# Patient Record
Sex: Female | Born: 2000 | Race: White | Hispanic: No | Marital: Single | Smoking: Never smoker
Health system: Southern US, Community
[De-identification: ages and names within clinical notes are randomized; demographics above are authoritative.]

## PROBLEM LIST (undated history)

## (undated) DIAGNOSIS — L509 Urticaria, unspecified: Secondary | ICD-10-CM

## (undated) DIAGNOSIS — T783XXA Angioneurotic edema, initial encounter: Secondary | ICD-10-CM

## (undated) DIAGNOSIS — T7840XA Allergy, unspecified, initial encounter: Secondary | ICD-10-CM

## (undated) DIAGNOSIS — R6889 Other general symptoms and signs: Secondary | ICD-10-CM

## (undated) DIAGNOSIS — J069 Acute upper respiratory infection, unspecified: Secondary | ICD-10-CM

## (undated) DIAGNOSIS — Z889 Allergy status to unspecified drugs, medicaments and biological substances status: Secondary | ICD-10-CM

## (undated) DIAGNOSIS — K219 Gastro-esophageal reflux disease without esophagitis: Secondary | ICD-10-CM

## (undated) DIAGNOSIS — F419 Anxiety disorder, unspecified: Secondary | ICD-10-CM

## (undated) DIAGNOSIS — F32A Depression, unspecified: Secondary | ICD-10-CM

## (undated) HISTORY — DX: Anxiety disorder, unspecified: F41.9

## (undated) HISTORY — DX: Allergy, unspecified, initial encounter: T78.40XA

## (undated) HISTORY — DX: Other general symptoms and signs: R68.89

## (undated) HISTORY — DX: Allergy status to unspecified drugs, medicaments and biological substances: Z88.9

## (undated) HISTORY — DX: Urticaria, unspecified: L50.9

## (undated) HISTORY — DX: Gastro-esophageal reflux disease without esophagitis: K21.9

## (undated) HISTORY — DX: Acute upper respiratory infection, unspecified: J06.9

## (undated) HISTORY — DX: Angioneurotic edema, initial encounter: T78.3XXA

## (undated) HISTORY — DX: Depression, unspecified: F32.A

---
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
## 2019-01-08 ENCOUNTER — Ambulatory Visit (INDEPENDENT_AMBULATORY_CARE_PROVIDER_SITE_OTHER): Payer: BLUE CROSS/BLUE SHIELD | Admitting: *Deleted

## 2019-01-08 DIAGNOSIS — J4550 Severe persistent asthma, uncomplicated: Secondary | ICD-10-CM | POA: Diagnosis not present

## 2019-01-10 ENCOUNTER — Other Ambulatory Visit: Payer: Self-pay | Admitting: Family Medicine

## 2019-01-10 DIAGNOSIS — L700 Acne vulgaris: Secondary | ICD-10-CM

## 2019-01-11 NOTE — Telephone Encounter (Signed)
CPE rescheduled for April 21, 2019-is it okay to refill until then?

## 2019-01-14 ENCOUNTER — Encounter: Payer: Self-pay | Admitting: Family Medicine

## 2019-01-14 ENCOUNTER — Other Ambulatory Visit (INDEPENDENT_AMBULATORY_CARE_PROVIDER_SITE_OTHER): Payer: BLUE CROSS/BLUE SHIELD

## 2019-01-14 ENCOUNTER — Other Ambulatory Visit: Payer: Self-pay

## 2019-01-14 DIAGNOSIS — Z23 Encounter for immunization: Secondary | ICD-10-CM

## 2019-01-21 ENCOUNTER — Ambulatory Visit: Payer: 59

## 2019-01-25 ENCOUNTER — Telehealth: Payer: Self-pay

## 2019-01-25 ENCOUNTER — Ambulatory Visit (INDEPENDENT_AMBULATORY_CARE_PROVIDER_SITE_OTHER): Payer: BLUE CROSS/BLUE SHIELD

## 2019-01-25 DIAGNOSIS — J4550 Severe persistent asthma, uncomplicated: Secondary | ICD-10-CM | POA: Diagnosis not present

## 2019-01-25 NOTE — Telephone Encounter (Signed)
Patient came in to get her Xolair and stated that she has been having problems with itchy eyes and was wondering if we could send in something for her. Please advise.

## 2019-01-27 NOTE — Telephone Encounter (Signed)
Sure.  See if Pazeo 1 drop each eye daily as needed is covered with her insurance plan. Can also try Optivar 1 drop each eye twice daily as needed if Pazeo was not covered. Pataday is also over-the-counter now if there are no recoverable eyedrops.

## 2019-01-27 NOTE — Telephone Encounter (Signed)
It appears that Optivar is covered while Pazeo is not.  Attempted to contact patient to inform and see which pharmacy to sent to.  Unable to leave message due to vm being full.  Meds have not been ordered at this time.

## 2019-01-28 MED ORDER — AZELASTINE HCL 0.05 % OP SOLN: 1.0000 [drp] | Freq: Two times a day (BID) | OPHTHALMIC | 5 refills | Status: DC

## 2019-01-28 NOTE — Telephone Encounter (Signed)
Unable to LVM but was able to send Optivar to [facility].

## 2019-01-28 NOTE — Addendum Note (Signed)
Addended by: Osa Craver on: 01/28/2019 02:18 PM   Modules accepted: Orders

## 2019-02-08 ENCOUNTER — Ambulatory Visit (INDEPENDENT_AMBULATORY_CARE_PROVIDER_SITE_OTHER): Payer: BLUE CROSS/BLUE SHIELD | Admitting: *Deleted

## 2019-02-08 ENCOUNTER — Other Ambulatory Visit: Payer: Self-pay

## 2019-02-08 DIAGNOSIS — J4550 Severe persistent asthma, uncomplicated: Secondary | ICD-10-CM | POA: Diagnosis not present

## 2019-02-09 ENCOUNTER — Other Ambulatory Visit: Payer: Self-pay | Admitting: Allergy and Immunology

## 2019-02-16 ENCOUNTER — Telehealth: Payer: Self-pay | Admitting: Family Medicine

## 2019-02-16 DIAGNOSIS — F419 Anxiety disorder, unspecified: Principal | ICD-10-CM

## 2019-02-16 DIAGNOSIS — F32A Depression, unspecified: Secondary | ICD-10-CM

## 2019-02-16 DIAGNOSIS — F329 Major depressive disorder, single episode, unspecified: Secondary | ICD-10-CM

## 2019-02-16 MED ORDER — CITALOPRAM HYDROBROMIDE 20 MG PO TABS: 20.0000 mg | ORAL_TABLET | Freq: Every day | ORAL | 0 refills | Status: DC

## 2019-02-16 NOTE — Telephone Encounter (Signed)
Pharmacy sent refill for citalopram 20 mg tablet, qty 90 with 1 refill, take one tablet by mouth every day, please send refill to [id number] - [place], [place] - [address] AT [address]

## 2019-02-22 ENCOUNTER — Ambulatory Visit (INDEPENDENT_AMBULATORY_CARE_PROVIDER_SITE_OTHER): Payer: BLUE CROSS/BLUE SHIELD

## 2019-02-22 ENCOUNTER — Other Ambulatory Visit: Payer: Self-pay

## 2019-02-22 DIAGNOSIS — J4550 Severe persistent asthma, uncomplicated: Secondary | ICD-10-CM

## 2019-02-26 ENCOUNTER — Other Ambulatory Visit: Payer: Self-pay

## 2019-02-26 MED ORDER — TRIAMCINOLONE ACETONIDE 55 MCG/ACT NA AERO: INHALATION_SPRAY | NASAL | 0 refills | Status: DC

## 2019-03-09 ENCOUNTER — Ambulatory Visit: Payer: Self-pay

## 2019-03-17 ENCOUNTER — Encounter: Payer: Self-pay | Admitting: Allergy

## 2019-03-17 ENCOUNTER — Ambulatory Visit (INDEPENDENT_AMBULATORY_CARE_PROVIDER_SITE_OTHER): Payer: BC Managed Care – PPO | Admitting: Allergy

## 2019-03-17 ENCOUNTER — Other Ambulatory Visit: Payer: Self-pay

## 2019-03-17 VITALS — BP 106/64 | HR 84 | Temp 98.2°F | Resp 16 | Ht 66.73 in | Wt 146.8 lb

## 2019-03-17 DIAGNOSIS — J301 Allergic rhinitis due to pollen: Secondary | ICD-10-CM

## 2019-03-17 DIAGNOSIS — T7800XD Anaphylactic reaction due to unspecified food, subsequent encounter: Secondary | ICD-10-CM | POA: Diagnosis not present

## 2019-03-17 DIAGNOSIS — J4550 Severe persistent asthma, uncomplicated: Secondary | ICD-10-CM | POA: Diagnosis not present

## 2019-03-17 NOTE — Patient Instructions (Addendum)
Severe persistent asthma   Continue Xolair injections and will try to spread out to every 3 weeks.   If asthma symptoms arise or needing albuterol use then resume every 2 week injections  Call [employer] student health and ask if they are able to provide Xolair injections there and let us know.  If not we will help to identify a local Allergist that will provide while at college.  Have access to albuterol inhaler 2 puffs every 4-6 hours as needed for cough/wheeze/shortness of breath/chest tightness.  May use 15-20 minutes prior to activity.   Monitor frequency of use.    Food Allergy  From June 2019 skin testing was negative which means that Xolair is having some protective effects against your food allergies    Continue avoidance of milk, dairy, shellfish, fish, peanut/tree nuts, coconut, sesame seed, apple, and mango    In case of an allergic reaction, give Benadryl 4 teaspoonfuls every 6 hours, and life-threatening  symptoms occur, inject with EpiPen 0.3 mg    follow emergency action plan in case of allergic reaction  .  Allergic rhinitis  Continue avoidance measures for dust mites, cat, dog, grass pollens, tree pollens,weed pollens and  cockroach  Continue with Claritin 10mg  daily.  Trial Allegra 180mg  to see if this is more effective than Claritin.     Continue Nasacort 2 sprays each nostril daily for nasal congestion  Continue Optivar eye drops 1 drop each eye twice a day as needed  Return in about 6 months or sooner if needed

## 2019-03-17 NOTE — Progress Notes (Signed)
Follow-up Note  RE: Samantha Cole MRN: [id number] DOB: [birth year] Date of Office Visit: 03/17/2019   History of present illness: Samantha Cole is a 18 y.o. female presenting today for follow-up of asthma, food allergy and allergic rhinitis.  She was last seen in the office on 03/27/18 by FNP Thermon Leyland.   She states she has been doing well since her last visit with [place] without any major health changes, surgeries or hospitalizations.  She states that her asthma has been under good control and she has not needed to use her albuterol.  She is on Xolair injections every 2 weeks.  She states today that she is about a week late thus this is her third week and she has not noticed any asthma symptoms or need to use her rescue inhaler.  We did try to space her from every 2 weeks to every 4 weeks about 2 years or so ago and she developed more asthma symptoms at every 4 weeks that she was changed back to 2-week injections with good improvement. She continues to avoid dairy, shellfish, fish, peanuts, tree nuts, sesame, apple and mango.  She has not had any accidental ingestions or need to use her epinephrine device. She does state that she has been having some itchy eyes and she has been using an eyedrop that has been helping.  She also has been having some runny nose and sneezing.  She does take Claritin daily and does have access to Nasacort that she uses as needed.   Review of systems: Review of Systems  Constitutional: Negative for chills, fever and malaise/fatigue.  HENT: Negative for congestion, ear discharge, nosebleeds and sore throat.   Eyes: Negative for pain, discharge and redness.  Respiratory: Negative for cough, shortness of breath and wheezing.   Cardiovascular: Negative for chest pain.  Gastrointestinal: Negative for abdominal pain, constipation, diarrhea, heartburn, nausea and vomiting.  Musculoskeletal: Negative for joint pain.  Skin: Negative for itching and rash.  Neurological: Negative  for headaches.    All other systems negative unless noted above in HPI  Past medical/social/surgical/family history have been reviewed and are unchanged unless specifically indicated below.  She will be attending [employer] in the fall.  Medication List: Allergies as of 03/17/2019      Reactions   Other Anaphylaxis   Tree nuts, Coconut, Sesame   Shellfish Allergy Anaphylaxis   Lactose Intolerance (gi)       Medication List       Accurate as of March 17, 2019 11:59 PM. If you have any questions, ask your nurse or doctor.        STOP taking these medications   azelastine 0.05 % ophthalmic solution Commonly known as:  OPTIVAR Stopped by:  Benjy Kana Larose Hires, MD     TAKE these medications   albuterol 108 (90 Base) MCG/ACT inhaler Commonly known as:  VENTOLIN HFA Inhale 2 puffs into the lungs as needed. Reported on 11/29/2015   citalopram 20 MG tablet Commonly known as:  CELEXA Take 1 tablet (20 mg total) by mouth daily.   Claritin 10 MG tablet Generic drug:  loratadine Take 10 mg by mouth daily.   Tri-Previfem 0.18/0.215/0.25 MG-35 MCG tablet Generic drug:  Norgestimate-Ethinyl Estradiol Triphasic TAKE 1 TABLET BY MOUTH EVERY DAY   triamcinolone 55 MCG/ACT Aero nasal inhaler Commonly known as:  Nasacort Allergy 24HR Use 2 sprays in each nostril once daily   Xolair 150 MG injection Generic drug:  omalizumab INJECT 375 MG UNDER  THE SKIN EVERY 14 DAYS       Known medication allergies: Allergies  Allergen Reactions  . Other Anaphylaxis    Tree nuts, Coconut, Sesame  . Shellfish Allergy Anaphylaxis  . Lactose Intolerance (Gi)      Physical examination: Blood pressure 106/64, pulse 84, temperature 98.2 F (36.8 C), temperature source Tympanic, resp. rate 16, height 5' 6.73" (1.695 m), weight 146 lb 12.8 oz (66.6 kg), SpO2 98 %.  General: Alert, interactive, in no acute distress. HEENT: PERRLA, TMs pearly gray, turbinates mildly edematous with clear  discharge, post-pharynx non erythematous. Neck: Supple without lymphadenopathy. Lungs: Clear to auscultation without wheezing, rhonchi or rales. {no increased work of breathing. CV: Normal S1, S2 without murmurs. Abdomen: Nondistended, nontender. Skin: Warm and dry, without lesions or rashes. Extremities:  No clubbing, cyanosis or edema. Neuro:   Grossly intact.  Diagnositics/Labs: Labs:  Component     Latest Ref Rng & Units 03/27/2018  D Pteronyssinus IgE     Class I kU/L 0.55 (A)  D Farinae IgE     Class II kU/L 1.37 (A)  Cat Dander IgE     Class III kU/L 2.97 (A)  Dog Dander IgE     Class V kU/L 47.50 (A)  [place] Grass IgE     Class III kU/L 1.85 (A)  Timothy Grass IgE     Class III kU/L 1.97 (A)  Johnson Grass IgE     Class III kU/L 1.96 (A)  Bahia Grass IgE     Class III kU/L 2.12 (A)  Cockroach, American IgE     Class I kU/L 0.36 (A)  Penicillium Chrysogen IgE     Class 0 kU/L <0.10  Cladosporium Herbarum IgE     Class 0 kU/L <0.10  Aspergillus Fumigatus IgE     Class 0 kU/L <0.10  Mucor Racemosus IgE     Class 0 kU/L <0.10  Alternaria Alternata IgE     Class 0 kU/L <0.10  Stemphylium Herbarum IgE     Class 0 kU/L <0.10  Common Silver Charletta CousinBirch IgE     Class III kU/L 1.90 (A)  Oak, White IgE     Class III kU/L 2.51 (A)  Elm, American IgE     Class III kU/L 1.97 (A)  Maple/Box Elder IgE     Class III kU/L 2.20 (A)  Hickory, White IgE     Class III kU/L  2.54 (A)  Amer Sycamore IgE Qn     Class III kU/L 2.36 (A)  White Mulberry IgE     Class II kU/L 1.33 (A)  Sweet gum IgE RAST Ql     Class III kU/L 2.36 (A)  Cedar, Mountain IgE     Class III kU/L 2.00 (A)  Ragweed, Short IgE     Class III kU/L 3.60 (A)  Mugwort IgE Qn     Class III kU/L 1.43 (A)  Plantain, English IgE     Class III kU/L 1.78 (A)  Pigweed, Rough IgE     Class III kU/L 1.62 (A)  Sheep Sorrel IgE Qn     Class II kU/L 1.37 (A)  Nettle IgE     Class III kU/L 1.98 (A)  Allergen  Apple, IgE     Class II kU/L 1.17 (A)  Mango IgE     Class II kU/L 1.07 (A)    Assessment and plan:   Severe persistent asthma   Continue Xolair injections and will try to spread out to  every 3 weeks.   If asthma symptoms arise or needing albuterol use then resume every 2 week injections  Call [employer] student health and ask if they are able to provide Xolair injections there and let us know.  If not we will help to identify a local Allergist that will provide while at college.  Have access to albuterol inhaler 2 puffs every 4-6 hours as needed for cough/wheeze/shortness of breath/chest tightness.  May use 15-20 minutes prior to activity.   Monitor frequency of use.    Food Allergy  From June 2019 skin testing was negative which means that Xolair is having some protective effects against your food allergies    Continue avoidance of milk, dairy, shellfish, fish, peanut/tree nuts, coconut, sesame seed, apple, and mango    In case of an allergic reaction, give Benadryl 4 teaspoonfuls every 6 hours, and life-threatening  symptoms occur, inject with EpiPen 0.3 mg    follow emergency action plan in case of allergic reaction  .  Allergic rhinitis  Continue avoidance measures for dust mites, cat, dog, grass pollens, tree pollens,weed pollens and  cockroach  Continue with Claritin 10mg  daily.  Trial Allegra 180mg  to see if this is more effective than Claritin.     Continue Nasacort 2 sprays each nostril daily for nasal congestion  Continue Optivar eye drops 1 drop each eye twice a day as needed  Return in about 6 months or sooner if needed  I appreciate the opportunity to take part in Tameka's care. Please do not hesitate to contact me with questions.  Sincerely,   Margo Aye, MD Allergy/Immunology [facility]

## 2019-03-26 ENCOUNTER — Other Ambulatory Visit: Payer: Self-pay | Admitting: Family Medicine

## 2019-04-07 ENCOUNTER — Ambulatory Visit: Payer: 59

## 2019-04-09 ENCOUNTER — Other Ambulatory Visit: Payer: Self-pay

## 2019-04-09 ENCOUNTER — Telehealth: Payer: Self-pay | Admitting: *Deleted

## 2019-04-09 ENCOUNTER — Ambulatory Visit (INDEPENDENT_AMBULATORY_CARE_PROVIDER_SITE_OTHER): Payer: 59 | Admitting: *Deleted

## 2019-04-09 DIAGNOSIS — J4550 Severe persistent asthma, uncomplicated: Secondary | ICD-10-CM

## 2019-04-09 NOTE — Telephone Encounter (Signed)
Error

## 2019-04-20 NOTE — Progress Notes (Signed)
Chief Complaint  Patient presents with  . Annual Exam    nonfasting annual exam. Brought in form for [facility] and immunizations. No concerns.    Samantha Cole is a 18 y.o. female who presents for a complete physical.  She has the following concerns: She needs forms filled out for school.  Brought in copies of her childhood immunizations.  Denies any other concerns or complaints.  Depression/Anxiety: She was started on citalopram in 05/2018.  She was seeing April Forsbrey at [facility] for counseling. Prior to starting meds: PHQ9 score of 14 GAD-7 score of 12  This was last discussed at her 07/2018 visit, at which time, she was doing better, procrastinating less, spending more time with friends. Scores at her f/u visit were: PHQ-9 score of 6  GAD-7 score of 5  Moods are good, just randomly feels down, once a month, not related to cycles, short-lived. She denies side effects from the medications, and feels like it is working well.  She will be attending [employer] in the Fall. Her boyfriend will be 4 hours away. 2nd lifetime partner, uses condoms. She is on OCP's for cramps and acne.  Menstrual cramps are significantly better, only occur one day of the cycle.  Periods are light and shorter.   Menarche age 9, monthly. Not heavy (on OCP's). Uses ibuprofen as needed for cramps  Acne. She has been on OCP's since 12/2016. Using Curology skin products, which is helping with her acne. No longer using Differin.  She has asthma and allergies, [facility]. She is on Xolair q3 weeks and doing well (changed to q 3 weeks from q2 weeks about 5 weeks ago, previously didn't do as well with q4 week schedule).  Needs albuterol only rarely. Uses claritin as needed for allergies, and nasal steroid only prn congestion. She has tree-nut and shellfish allergy. She has an Auvi-Q pen.  We wrote letter to try and get a single room at [facility] due to her allergies. She reports  she was assigned a roommate, and she is okay with that.   January 2018: She had Hep B Core antibody + on testing from [place] (donated blood at school blood drive). Letter states that additional testing related to Hepatitis B virus were negative, and that she is NOT infected with the virus. Additional testing wasn't recommended from a doctor's office, but was performed 10/2017 and was normal:  Ref Range & Units 60yrago  Hepatitis B Surface Ag Negative Negative   Hep B E Ag Negative Negative   Hep B C IgM Negative Negative   Hep B Core Total Ab Negative Negative   Hep B E Ab Negative Negative   Hep B Surface Ab, Qual  Reactive   Comment:       Non Reactive: Inconsistent with immunity,                less than 10 mIU/mL         Reactive:   Consistent with immunity,                greater than 9.9 mIU/mL      Immunization History  Administered Date(s) Administered  . HPV 9-valent 07/22/2018  . HPV Quadrivalent 01/14/2019  . Influenza Split 08/10/2012  . Influenza,inj,Quad PF,6+ Mos 09/15/2013, 08/17/2014, 09/04/2015, 08/12/2016, 08/15/2017, 07/22/2018  . Meningococcal B, OMV 11/10/2017  . Meningococcal Conjugate 03/29/2015  . Meningococcal Mcv4o 11/10/2017  . Tdap 06/09/2012  She had c-met, CBC, TSH and Vitamin D in 04/2018.  Vit D-OH was normal at 60.1 (previously had been slightly low at 29) Lipids: Lab Results  Component Value Date   CHOL 146 03/29/2015   HDL 72 03/29/2015   LDLCALC 53 03/29/2015   TRIG 105 03/29/2015   CHOLHDL 2.0 03/29/2015   Last Pap smear: never Dentist: twice a year Ophtho: every 1-2 years (has glasses/contacts) Exercise: Played volleyball in the Fall.  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 Gets to visit her niece frequently in [place].  Past Medical History:  Diagnosis Date  . Abnormality on screening test 10/2016   when tried to donate blood, anti  HepB Core Ab +  . Angio-edema   . Asthma age 7-9   Dr. Ishmael Holter  . Multiple allergies    completed environmental immunotherapy ( with Dr. Donneta Romberg) and multiple food allergies  . Recurrent upper respiratory infection (URI)   . Urticaria     Past Surgical History:  Procedure Laterality Date  . WISDOM TOOTH EXTRACTION  08/2016    Social History   Socioeconomic History  . Marital status: Single    Spouse name: Not on file  . Number of children: Not on file  . Years of education: Not on file  . Highest education level: Not on file  Occupational History  . Not on file  Social Needs  . Financial resource strain: Not on file  . Food insecurity    Worry: Not on file    Inability: Not on file  . Transportation needs    Medical: Not on file    Non-medical: Not on file  Tobacco Use  . Smoking status: Never Smoker  . Smokeless tobacco: Never Used  Substance and Sexual Activity  . Alcohol use: No  . Drug use: No  . Sexual activity: Yes    Partners: Male    Birth control/protection: Condom, Pill    Comment: 2 lifetime partners  Lifestyle  . Physical activity    Days per week: Not on file    Minutes per session: Not on file  . Stress: Not on file  Relationships  . Social Herbalist on phone: Not on file    Gets together: Not on file    Attends religious service: Not on file    Active member of club or organization: Not on file    Attends meetings of clubs or organizations: Not on file    Relationship status: Not on file  . Intimate partner violence    Fear of current or ex partner: Not on file    Emotionally abused: Not on file    Physically abused: Not on file    Forced sexual activity: Not on file  Other Topics Concern  . Not on file  Social History Narrative   Graduated from [employer]. Lives with parents, 3 dogs.  2 older sisters (1 lives in [place] and has a daughter; other sister moved to [place], [place])   Wants to go to [employer]    Family History   Problem Relation Age of Onset  . Allergies Father   . Asthma Father   . Asthma Mother        childhood  . Diabetes Neg Hx   . Heart disease Neg Hx   . Allergic rhinitis Neg Hx   . Angioedema Neg Hx   . Eczema Neg Hx   . Immunodeficiency Neg Hx   . Urticaria  Neg Hx     Outpatient Encounter Medications as of 04/21/2019  Medication Sig  . citalopram (CELEXA) 20 MG tablet Take 1 tablet (20 mg total) by mouth daily.  Marland Kitchen loratadine (CLARITIN) 10 MG tablet Take 10 mg by mouth daily.  . Norgestimate-Ethinyl Estradiol Triphasic (TRI-PREVIFEM) 0.18/0.215/0.25 MG-35 MCG tablet Take 1 tablet by mouth daily.  Marland Kitchen triamcinolone (NASACORT) 55 MCG/ACT AERO nasal inhaler SPRAY 2 SPRAYS INTO EACH NOSTRIL EVERY DAY  . XOLAIR 150 MG injection INJECT 375 MG UNDER THE SKIN EVERY 14 DAYS  . [DISCONTINUED] citalopram (CELEXA) 20 MG tablet Take 1 tablet (20 mg total) by mouth daily.  . [DISCONTINUED] TRI-PREVIFEM 0.18/0.215/0.25 MG-35 MCG tablet TAKE 1 TABLET BY MOUTH EVERY DAY  . albuterol (PROVENTIL HFA;VENTOLIN HFA) 108 (90 Base) MCG/ACT inhaler Inhale 2 puffs into the lungs as needed. Reported on 11/29/2015 (Patient not taking: Reported on 04/21/2019)   Facility-Administered Encounter Medications as of 04/21/2019  Medication  . omalizumab Arvid Right) injection 375 mg    Allergies  Allergen Reactions  . Other Anaphylaxis    Tree nuts, Coconut, Sesame  . Shellfish Allergy Anaphylaxis  . Lactose Intolerance (Gi)     ROS: No fever, chills, headaches, dizziness, fainting spells, URI symptoms ,cough, shortness of breath, chest pain, palpitations, nausea, vomiting, bowel changes, urinary complaints. Periods are regular. No bleeding, bruising, rash, joint pains or other concerns. Acne improved. Moods improved. See HPI Weight gain noted--thinks related to less exercise since volleyball ended, and related to the pandemic.   PHYSICAL EXAM:  BP 100/60   Pulse 72   Temp 98.9 F (37.2 C) (Temporal)   Ht 5' 8.5"  (1.74 m)   Wt 153 lb (69.4 kg)   LMP 04/03/2019 (Exact Date)   BMI 22.93 kg/m   Wt Readings from Last 3 Encounters:  04/21/19 153 lb (69.4 kg) (85 %, Z= 1.03)*  03/17/19 146 lb 12.8 oz (66.6 kg) (80 %, Z= 0.85)*  11/12/18 136 lb 3.2 oz (61.8 kg) (70 %, Z= 0.51)*   * Growth percentiles are based on CDC (Girls, 2-20 Years) data.    Well developed, pleasant, cooperative female in no distress HEENT: PERRL, EOMI, conjunctiva clear. TM's and EAC's normal. Nose and mouth not examined, wearing mask due to COVID-19 pandemic. Neck: no lymphadenopathy, thyromegaly or mass Heart: regular rate and rhythm without murmur Lungs: clear bilaterally. Good air movement. No wheezes, rales, ronchi Abdomen: soft, nontender, no organomegaly or mass Back: no spinal tenderness, scoliosis or CVA tenderness Breast exam: no nipple discharge, inversion, no skin dimpling, breast masses or axillary lymphadenopathy Pelvic deferred Extremities: no clubbing, cyanosis or edema. 2+ pulses Skin: no rashes, suspicious lesions or bruising. Below right knee there is a slightly scabbed area--pt states she has been treating a wart with OTC treatment, doesn't think it is getting better. Psych: normal mood, affect, hygiene and grooming Neuro: alert and oriented; cranial nerves intact. Normal strength, sensation, gait, and DTR's 2+ and symmetric.  GAD-7 score of 3 PHQ-9 score of 3   ASSESSMENT/PLAN:  Annual physical exam - Plan: GC/Chlamydia Probe Amp  Anxiety and depression - doing well on citalopram, continue. If worsening at school, encouraged her to contact therapist (at home, or school counselor), as well as [place] - Plan: citalopram (CELEXA) 20 MG tablet  Acne vulgaris - improved/controlled - Plan: Norgestimate-Ethinyl Estradiol Triphasic (TRI-PREVIFEM) 0.18/0.215/0.25 MG-35 MCG tablet,  Need for meningitis vaccination - Plan: Meningococcal B, OMV (Bexsero)  Need for HPV vaccination - Plan: HPV 9-valent  vaccine,Recombinat  3rd  gardisil and 2nd Bexsero given today. FFO for school.  Can return for wart treatment in 2-3 weeks (after scabbing resolved, if there is residual wart).  Weight gain--counseled re: healthy diet, exercise.  Current weight is healthy, but don't want her to continue to gain at this weight. Counseled regarding sexual activity (limiting partners, condom use), avoidance of alcohol, drugs; discussed proper sunscreen use, seatbelt use, undistracted driving.  F/u 1 year, sooner prn.

## 2019-04-21 ENCOUNTER — Ambulatory Visit (INDEPENDENT_AMBULATORY_CARE_PROVIDER_SITE_OTHER): Payer: 59 | Admitting: Family Medicine

## 2019-04-21 ENCOUNTER — Encounter: Payer: Self-pay | Admitting: Family Medicine

## 2019-04-21 ENCOUNTER — Other Ambulatory Visit: Payer: Self-pay

## 2019-04-21 VITALS — BP 100/60 | HR 72 | Temp 98.9°F | Ht 68.5 in | Wt 153.0 lb

## 2019-04-21 DIAGNOSIS — Z23 Encounter for immunization: Secondary | ICD-10-CM | POA: Diagnosis not present

## 2019-04-21 DIAGNOSIS — F329 Major depressive disorder, single episode, unspecified: Secondary | ICD-10-CM

## 2019-04-21 DIAGNOSIS — L700 Acne vulgaris: Secondary | ICD-10-CM

## 2019-04-21 DIAGNOSIS — Z0000 Encounter for general adult medical examination without abnormal findings: Secondary | ICD-10-CM | POA: Diagnosis not present

## 2019-04-21 DIAGNOSIS — F32A Depression, unspecified: Secondary | ICD-10-CM

## 2019-04-21 DIAGNOSIS — F419 Anxiety disorder, unspecified: Secondary | ICD-10-CM | POA: Diagnosis not present

## 2019-04-21 MED ORDER — NORGESTIM-ETH ESTRAD TRIPHASIC 0.18/0.215/0.25 MG-35 MCG PO TABS: 1.0000 | ORAL_TABLET | Freq: Every day | ORAL | 3 refills | Status: DC

## 2019-04-21 MED ORDER — CITALOPRAM HYDROBROMIDE 20 MG PO TABS: 20.0000 mg | ORAL_TABLET | Freq: Every day | ORAL | 3 refills | Status: DC

## 2019-04-21 NOTE — Patient Instructions (Addendum)
  HEALTH MAINTENANCE RECOMMENDATIONS:  It is recommended that you get at least 30 minutes of aerobic exercise at least 5 days/week (for weight loss, you may need as much as 60-90 minutes). This can be any activity that gets your heart rate up. This can be divided in 10-15 minute intervals if needed, but try and build up your endurance at least once a week.  Weight bearing exercise is also recommended twice weekly.  Eat a healthy diet with lots of vegetables, fruits and fiber.  "Colorful" foods have a lot of vitamins (ie green vegetables, tomatoes, red peppers, etc).  Limit sweet tea, regular sodas and alcoholic beverages, all of which has a lot of calories and sugar. Drink a lot of water.  Calcium recommendations are 1200 mg daily and vitamin D 1000 IU daily.  This should be obtained from diet and/or supplements (vitamins), and calcium should not be taken all at once, but in divided doses.  Monthly self breast exams and yearly mammograms for women over the age of 57 is recommended.  Sunscreen of at least SPF 30 should be used on all sun-exposed parts of the skin when outside between the hours of 10 am and 4 pm (not just when at beach or pool, but even with exercise, golf, tennis, and yard work!)  Use a sunscreen that says "broad spectrum" so it covers both UVA and UVB rays, and make sure to reapply every 1-2 hours.  Remember to change the batteries in your smoke detectors when changing your clock times in the spring and fall. Carbon monoxide detectors are recommended for your home.  Use your seat belt every time you are in a car, and please drive safely and not be distracted with cell phones and texting while driving.  Use condoms for prevention of sexually transmitted disease. Avoid use of alcohol, tobacco and recreational drugs. Try and limit screen time, and get regular exercise.  Continue your current medications. Please reach out with any questions or concerns.

## 2019-04-22 LAB — GC/CHLAMYDIA PROBE AMP
Chlamydia trachomatis, NAA: NEGATIVE
Neisseria Gonorrhoeae by PCR: NEGATIVE

## 2019-04-27 DIAGNOSIS — M9903 Segmental and somatic dysfunction of lumbar region: Secondary | ICD-10-CM | POA: Diagnosis not present

## 2019-04-27 DIAGNOSIS — M542 Cervicalgia: Secondary | ICD-10-CM | POA: Diagnosis not present

## 2019-04-27 DIAGNOSIS — M546 Pain in thoracic spine: Secondary | ICD-10-CM | POA: Diagnosis not present

## 2019-04-27 DIAGNOSIS — M9901 Segmental and somatic dysfunction of cervical region: Secondary | ICD-10-CM | POA: Diagnosis not present

## 2019-04-29 DIAGNOSIS — M546 Pain in thoracic spine: Secondary | ICD-10-CM | POA: Diagnosis not present

## 2019-04-29 DIAGNOSIS — M9903 Segmental and somatic dysfunction of lumbar region: Secondary | ICD-10-CM | POA: Diagnosis not present

## 2019-04-29 DIAGNOSIS — M9901 Segmental and somatic dysfunction of cervical region: Secondary | ICD-10-CM | POA: Diagnosis not present

## 2019-04-29 DIAGNOSIS — M542 Cervicalgia: Secondary | ICD-10-CM | POA: Diagnosis not present

## 2019-04-30 ENCOUNTER — Ambulatory Visit (INDEPENDENT_AMBULATORY_CARE_PROVIDER_SITE_OTHER): Payer: 59 | Admitting: *Deleted

## 2019-04-30 ENCOUNTER — Telehealth: Payer: Self-pay | Admitting: *Deleted

## 2019-04-30 DIAGNOSIS — J4550 Severe persistent asthma, uncomplicated: Secondary | ICD-10-CM | POA: Diagnosis not present

## 2019-04-30 NOTE — Telephone Encounter (Signed)
Patient is leaving for [employer] on August 3rd. She is every 3 weeks for her Xolair but would like to come at 2 weeks to get a dose before she leaves since she does not have an allergist in [place] yet to give her Xolair. Her script is still for every 2 weeks. Is it okay with you? Please advise.

## 2019-04-30 NOTE — Telephone Encounter (Signed)
She has not spoke with the school as of today.

## 2019-04-30 NOTE — Telephone Encounter (Signed)
Yes that is fine.   I believe I had asked her at last visit to see if student health will be able to store and provide her Xolair?  Do we know if they are able to do so?    If not does she want [place] to recommend a local allergist in [place] area that we can ship Xolair to and they can take over providing injections while in school?

## 2019-05-11 NOTE — Progress Notes (Signed)
Chief Complaint  Patient presents with  . Verrucous Vulgaris    right knee,wants removed     Patient presents for treatment of wart below her right knee.  It was noted at her recent CPE, and didn't respond to OTC treatments.  There are two small warts left in the area that was treated.  She leaves for college on Monday and would like it treated.  PMH, PSH, SH reviewed  Outpatient Encounter Medications as of 05/12/2019  Medication Sig Note  . citalopram (CELEXA) 20 MG tablet Take 1 tablet (20 mg total) by mouth daily.   Marland Kitchen loratadine (CLARITIN) 10 MG tablet Take 10 mg by mouth daily.   . Norgestimate-Ethinyl Estradiol Triphasic (TRI-PREVIFEM) 0.18/0.215/0.25 MG-35 MCG tablet Take 1 tablet by mouth daily.   Marland Kitchen triamcinolone (NASACORT) 55 MCG/ACT AERO nasal inhaler SPRAY 2 SPRAYS INTO EACH NOSTRIL EVERY DAY   . XOLAIR 150 MG injection INJECT 375 MG UNDER THE SKIN EVERY 14 DAYS   . albuterol (PROVENTIL HFA;VENTOLIN HFA) 108 (90 Base) MCG/ACT inhaler Inhale 2 puffs into the lungs as needed. Reported on 11/29/2015 (Patient not taking: Reported on 04/21/2019) 05/12/2019: Prn    Facility-Administered Encounter Medications as of 05/12/2019  Medication  . omalizumab Arvid Right) injection 375 mg   Allergies  Allergen Reactions  . Other Anaphylaxis    Tree nuts, Coconut, Sesame  . Shellfish Allergy Anaphylaxis  . Lactose Intolerance (Gi)    ROS:  No fever, chills, URI symptoms or other concerns, just the wart below her right knee.   PHYSICAL EXAM:  BP 100/60   Pulse 72   Temp 98.2 F (36.8 C) (Oral)   Ht 5' 7.5" (1.715 m)   Wt 157 lb 9.6 oz (71.5 kg)   SpO2 98%   BMI 24.32 kg/m   Well appearing, pleasant female in good spirits. Below the right knee there is a hypopigmented area with 2 small (37mm) raised verrucal lesions.  These were treated with Verruca-Freeze in freeze-thaw-freeze method (3 treatments total). She tolerated this well.  ASSESSMENT/PLAN:  Verruca vulgaris - Plan: PR  DESTRUC PREMAL,FIRST LESION   Counseled re: expectations and proper wound care Repeat treatment in 3-4 weeks if not completely resolved after heals.

## 2019-05-12 ENCOUNTER — Encounter: Payer: Self-pay | Admitting: Family Medicine

## 2019-05-12 ENCOUNTER — Other Ambulatory Visit: Payer: Self-pay

## 2019-05-12 ENCOUNTER — Ambulatory Visit (INDEPENDENT_AMBULATORY_CARE_PROVIDER_SITE_OTHER): Payer: BC Managed Care – PPO | Admitting: Family Medicine

## 2019-05-12 VITALS — BP 100/60 | HR 72 | Temp 98.2°F | Ht 67.5 in | Wt 157.6 lb

## 2019-05-12 DIAGNOSIS — B078 Other viral warts: Secondary | ICD-10-CM

## 2019-05-12 DIAGNOSIS — B079 Viral wart, unspecified: Secondary | ICD-10-CM

## 2019-05-14 ENCOUNTER — Other Ambulatory Visit: Payer: Self-pay

## 2019-05-14 ENCOUNTER — Ambulatory Visit (INDEPENDENT_AMBULATORY_CARE_PROVIDER_SITE_OTHER): Payer: 59

## 2019-05-14 DIAGNOSIS — J4550 Severe persistent asthma, uncomplicated: Secondary | ICD-10-CM

## 2019-05-28 ENCOUNTER — Ambulatory Visit: Payer: Self-pay

## 2019-06-04 ENCOUNTER — Other Ambulatory Visit: Payer: Self-pay

## 2019-06-04 ENCOUNTER — Ambulatory Visit (INDEPENDENT_AMBULATORY_CARE_PROVIDER_SITE_OTHER): Payer: 59

## 2019-06-04 DIAGNOSIS — J4550 Severe persistent asthma, uncomplicated: Secondary | ICD-10-CM | POA: Diagnosis not present

## 2019-06-05 DIAGNOSIS — Z20828 Contact with and (suspected) exposure to other viral communicable diseases: Secondary | ICD-10-CM | POA: Diagnosis not present

## 2019-06-05 DIAGNOSIS — U071 COVID-19: Secondary | ICD-10-CM | POA: Diagnosis not present

## 2019-06-25 ENCOUNTER — Other Ambulatory Visit: Payer: Self-pay

## 2019-06-25 ENCOUNTER — Ambulatory Visit (INDEPENDENT_AMBULATORY_CARE_PROVIDER_SITE_OTHER): Payer: BC Managed Care – PPO

## 2019-06-25 DIAGNOSIS — J4550 Severe persistent asthma, uncomplicated: Secondary | ICD-10-CM | POA: Diagnosis not present

## 2019-07-15 ENCOUNTER — Other Ambulatory Visit: Payer: Self-pay

## 2019-07-15 ENCOUNTER — Ambulatory Visit (INDEPENDENT_AMBULATORY_CARE_PROVIDER_SITE_OTHER): Payer: BC Managed Care – PPO | Admitting: *Deleted

## 2019-07-15 DIAGNOSIS — J4550 Severe persistent asthma, uncomplicated: Secondary | ICD-10-CM | POA: Diagnosis not present

## 2019-07-16 ENCOUNTER — Ambulatory Visit: Payer: Self-pay

## 2019-07-20 ENCOUNTER — Other Ambulatory Visit (INDEPENDENT_AMBULATORY_CARE_PROVIDER_SITE_OTHER): Payer: BC Managed Care – PPO

## 2019-07-20 ENCOUNTER — Other Ambulatory Visit: Payer: Self-pay

## 2019-07-20 DIAGNOSIS — Z23 Encounter for immunization: Secondary | ICD-10-CM

## 2019-08-05 ENCOUNTER — Ambulatory Visit: Payer: Self-pay

## 2019-08-05 ENCOUNTER — Other Ambulatory Visit: Payer: Self-pay

## 2019-08-05 DIAGNOSIS — Z20822 Contact with and (suspected) exposure to covid-19: Secondary | ICD-10-CM

## 2019-08-07 LAB — NOVEL CORONAVIRUS, NAA: SARS-CoV-2, NAA: NOT DETECTED

## 2019-08-13 ENCOUNTER — Ambulatory Visit (INDEPENDENT_AMBULATORY_CARE_PROVIDER_SITE_OTHER): Payer: BC Managed Care – PPO

## 2019-08-13 ENCOUNTER — Other Ambulatory Visit: Payer: Self-pay

## 2019-08-13 DIAGNOSIS — J4550 Severe persistent asthma, uncomplicated: Secondary | ICD-10-CM

## 2019-08-21 DIAGNOSIS — Z20828 Contact with and (suspected) exposure to other viral communicable diseases: Secondary | ICD-10-CM | POA: Diagnosis not present

## 2019-08-24 DIAGNOSIS — Z20828 Contact with and (suspected) exposure to other viral communicable diseases: Secondary | ICD-10-CM | POA: Diagnosis not present

## 2019-08-28 DIAGNOSIS — Z20828 Contact with and (suspected) exposure to other viral communicable diseases: Secondary | ICD-10-CM | POA: Diagnosis not present

## 2019-09-03 ENCOUNTER — Ambulatory Visit: Payer: Self-pay

## 2019-09-07 ENCOUNTER — Ambulatory Visit (INDEPENDENT_AMBULATORY_CARE_PROVIDER_SITE_OTHER): Payer: BC Managed Care – PPO

## 2019-09-07 ENCOUNTER — Other Ambulatory Visit: Payer: Self-pay

## 2019-09-07 DIAGNOSIS — J4550 Severe persistent asthma, uncomplicated: Secondary | ICD-10-CM

## 2019-09-13 ENCOUNTER — Other Ambulatory Visit: Payer: Self-pay

## 2019-09-13 DIAGNOSIS — Z20822 Contact with and (suspected) exposure to covid-19: Secondary | ICD-10-CM

## 2019-09-14 LAB — NOVEL CORONAVIRUS, NAA: SARS-CoV-2, NAA: NOT DETECTED

## 2019-09-27 ENCOUNTER — Ambulatory Visit (INDEPENDENT_AMBULATORY_CARE_PROVIDER_SITE_OTHER): Payer: BC Managed Care – PPO

## 2019-09-27 ENCOUNTER — Other Ambulatory Visit: Payer: Self-pay

## 2019-09-27 DIAGNOSIS — J4550 Severe persistent asthma, uncomplicated: Secondary | ICD-10-CM | POA: Diagnosis not present

## 2019-09-28 ENCOUNTER — Ambulatory Visit: Payer: Self-pay

## 2019-10-10 DIAGNOSIS — Z20828 Contact with and (suspected) exposure to other viral communicable diseases: Secondary | ICD-10-CM | POA: Diagnosis not present

## 2019-10-18 ENCOUNTER — Other Ambulatory Visit: Payer: Self-pay

## 2019-10-18 ENCOUNTER — Ambulatory Visit (INDEPENDENT_AMBULATORY_CARE_PROVIDER_SITE_OTHER): Payer: BC Managed Care – PPO | Admitting: *Deleted

## 2019-10-18 DIAGNOSIS — J4550 Severe persistent asthma, uncomplicated: Secondary | ICD-10-CM

## 2019-10-21 ENCOUNTER — Encounter: Payer: Self-pay | Admitting: Family Medicine

## 2019-10-21 ENCOUNTER — Other Ambulatory Visit: Payer: Self-pay

## 2019-10-21 ENCOUNTER — Ambulatory Visit (INDEPENDENT_AMBULATORY_CARE_PROVIDER_SITE_OTHER): Payer: BC Managed Care – PPO | Admitting: Family Medicine

## 2019-10-21 VITALS — BP 110/70 | HR 64 | Temp 96.9°F | Ht 68.0 in | Wt 167.6 lb

## 2019-10-21 DIAGNOSIS — Z8349 Family history of other endocrine, nutritional and metabolic diseases: Secondary | ICD-10-CM

## 2019-10-21 DIAGNOSIS — R635 Abnormal weight gain: Secondary | ICD-10-CM

## 2019-10-21 DIAGNOSIS — H6690 Otitis media, unspecified, unspecified ear: Secondary | ICD-10-CM

## 2019-10-21 DIAGNOSIS — E559 Vitamin D deficiency, unspecified: Secondary | ICD-10-CM

## 2019-10-21 DIAGNOSIS — J069 Acute upper respiratory infection, unspecified: Secondary | ICD-10-CM

## 2019-10-21 DIAGNOSIS — R5383 Other fatigue: Secondary | ICD-10-CM | POA: Diagnosis not present

## 2019-10-21 MED ORDER — AZITHROMYCIN 250 MG PO TABS: ORAL_TABLET | ORAL | 0 refills | Status: DC

## 2019-10-21 NOTE — Progress Notes (Signed)
Chief Complaint  Patient presents with  . Consult    increase in weight- was 121 -10/2017. And cycle is heavier and more irregular-was coming on Tues and now Thursday and taking pills as she was. Her GI system- stools are normal but feels urgency at times, then sometimes she feels the urgency and doesn't have to go-but then has intense cramping. Left ear has been popping and thinks she has a sinus infection-yellow drainage. (did not tell front this or me-told front her ear was popping per sheet).    Patient presents with multiple concerns.  Patient is concerned about her weight gain. She is a Museum/gallery exhibitions officer at [employer].  Was on campus for about a month, was walking 5 miles/day, and reported gaining weight even during that time.   +FHx thyroid disease, would like it checked. She noticed that her hair is slightly thinner than usual, and a little greasier; skin is always dry. Denies change in energy. Denies changes in moods, doing well on citalopram. Sleeps well and gets enough sleep.  No significant changes in diet. She had a low meal plan, would buy groceries and cook (due to concerns for food allergies).  Didn't feel like diet was significantly different. Alcohol--once a month, not regularly. Some change in bowels--has a little fecal urgency.  Once had cramping every 5 minutes on drive from [place] (and didn't even end up having a bowel movement when she got home). Reports she gets cramps like that a couple of days/week. Hasn't made any associations with foods that trigger it.  She is on OCP's, which haven't changed. Acne remains well controlled.  She reports that she had a yeast infection under her mask, resolved with antifungal cream.   Had stuffy nose around Christmas, had been outside a lot.  Wanted to be safe so got a COVID test on 12/27 which was negative.  When she made appointment and checked in today, she only reported ear pain, denied other URI symtpoms.  Once in exam room, reported  additional URI symptoms. She states that 2 days ago she started with L ear pain and popping.  She also has sinus pain in both cheeks.  Drainage from nose is yellow. +PND.  Denies cough or shortness of breath. She is scheduled for a COVID test on Monday in order to return to campus.  Patient has asthma, and reports that her breathing has been at baseline, not needing any albuterol (last was a month ago, when exercising).  H/o vitamin D deficiency.  Currently taking MVI daily.  PMH, PSH, SH reviewed  Outpatient Encounter Medications as of 10/21/2019  Medication Sig Note  . citalopram (CELEXA) 20 MG tablet Take 1 tablet (20 mg total) by mouth daily.   Marland Kitchen loratadine (CLARITIN) 10 MG tablet Take 10 mg by mouth daily.   . Norgestimate-Ethinyl Estradiol Triphasic (TRI-PREVIFEM) 0.18/0.215/0.25 MG-35 MCG tablet Take 1 tablet by mouth daily.   Marland Kitchen triamcinolone (NASACORT) 55 MCG/ACT AERO nasal inhaler SPRAY 2 SPRAYS INTO EACH NOSTRIL EVERY DAY 10/21/2019: Now every 3 weeks  . XOLAIR 150 MG injection INJECT 375 MG UNDER THE SKIN EVERY 14 DAYS   . albuterol (PROVENTIL HFA;VENTOLIN HFA) 108 (90 Base) MCG/ACT inhaler Inhale 2 puffs into the lungs as needed. Reported on 11/29/2015 (Patient not taking: Reported on 04/21/2019) 05/12/2019: Prn    Facility-Administered Encounter Medications as of 10/21/2019  Medication  . omalizumab Arvid Right) injection 375 mg   Allergies  Allergen Reactions  . Other Anaphylaxis    Tree nuts, Coconut,  Sesame  . Shellfish Allergy Anaphylaxis  . Lactose Intolerance (Gi)    ROS:  Denies fever, chills, dizziness, headache (just sinus).  No nausea, vomiting, diarrhea, fever, chills, rashes.  No loss of taste or smell. +weight gain, changes to cycles and bowels per HPI L ear pain and sinus pressure per HPI. Moods are stable. See HPI  PHYSICAL EXAM:  BP 110/70   Pulse 64   Temp (!) 96.9 F (36.1 C) (Other (Comment))   Ht 5\' 8"  (1.727 m)   Wt 167 lb 9.6 oz (76 kg)   LMP 10/20/2019    BMI 25.48 kg/m   Wt Readings from Last 3 Encounters:  10/21/19 167 lb 9.6 oz (76 kg) (91 %, Z= 1.36)*  05/12/19 157 lb 9.6 oz (71.5 kg) (87 %, Z= 1.15)*  04/21/19 153 lb (69.4 kg) (85 %, Z= 1.03)*   * Growth percentiles are based on CDC (Girls, 2-20 Years) data.   Wt 129# 6.4 oz 09/2018 Wt 123# 04/2018 (thyroid test normal on this date) Wt 121# 10/2017  Pleasant female, who sounds congested, with occasional sniffling.  In no distress She is alert, oriented HEENT: conjunctiva and sclera are clear, EOMI.  TM's are mildly erythematous, L>R, normal light reflex.  EAC's normal. Nasal mucosa is moderately edematous, no erythema, some white drainage present. Sinuses nontender. OP is clear Neck: no lymphadenopathy or thyromegaly Heart: regular rate and rhythm Lungs: clear, no wheezing Skin: normal turgor, no rash Neuro: alert and oriented, normal cranial nerves, normal gait Psych: normal mood, affect, hygiene and grooming   ASSESSMENT/PLAN:  Weight gain - counseled re: diet, exercise.  Check TSH - Plan: TSH  Vitamin D deficiency - due for recheck - Plan: Vitamin D 25 hydroxy  Family history of thyroid disease - Plan: TSH  Acute otitis media, unspecified otitis media type - Plan: azithromycin (ZITHROMAX) 250 MG tablet  Upper respiratory tract infection, unspecified type - suspect viral, acute onset. Cannot necessarily r/o COVID, as her last test was prior to current symptoms. Supportive measures. Testing Monday. Isolate  Supportive measures reviewed. Do not suspect bacterial infection. It is possible that her otitis may also be viral, but will cover with ABX.

## 2019-10-21 NOTE — Patient Instructions (Addendum)
Drink plenty of water. Continue your sinus rinses. Use sudafed to help with sinus pressure and ear pain. Take the antibiotics as directed.  It is hard to say if this is a true bacterial sinus infection vs a viral infection, given that your symptoms have only been present for a couple of days (and they can look exactly the same).  The antibiotics were given due to the redness of your ears. If symptoms aren't improving, there could be many reasons--including COVID. You have a test already scheduled. If you're getting significantly worse with more symptoms, consider getting tested sooner.   Pay attention to your diet to see if there are triggers for the abdominal cramps/stool urgency.   MyPlate from USDA  MyPlate is an outline of a general healthy diet based on the 2010 Dietary Guidelines for Americans, from the U.S. Department of Agriculture Architect). It sets guidelines for how To follow MyPlate recommendations:  Eat a wide variety of fruits and vegetables, grains, and protein foods.  Serve smaller portions and eat less food throughout the day.  Limit portion sizes to avoid overeating.  Enjoy your food.  Get at least 150 minutes of exercise every week. This is about 30 minutes each day, 5 or more days per week. It can be difficult to have every meal look like MyPlate. Think about MyPlate as eating guidelines for an entire day, rather than each individual meal. Fruits and vegetables  Make half of your plate fruits and vegetables.  Eat many different colors of fruits and vegetables each day.  For a 2,000 calorie daily food plan, eat: ? 2 cups of vegetables every day. ? 2 cups of fruit every day.  1 cup is equal to: ? 1 cup raw or cooked vegetables. ? 1 cup raw fruit. ? 1 medium-sized orange, apple, or banana. ? 1 cup 100% fruit or vegetable juice. ? 2 cups raw leafy greens, such as lettuce, spinach, or kale. ?  cup dried fruit. Grains  One fourth of your plate should be  grains.  Make at least half of the grains you eat each day whole grains.  For a 2,000 calorie daily food plan, eat 6 oz of grains every day.  1 oz is equal to: ? 1 slice bread. ? 1 cup cereal. ?  cup cooked rice, cereal, or pasta. Protein  One fourth of your plate should be protein.  Eat a wide variety of protein foods, including meat, poultry, fish, eggs, beans, nuts, and tofu.  For a 2,000 calorie daily food plan, eat 5 oz of protein every day.  1 oz is equal to: ? 1 oz meat, poultry, or fish. ?  cup cooked beans. ? 1 egg. ?  oz nuts or seeds. ? 1 Tbsp peanut butter. Dairy  Drink fat-free or low-fat (1%) milk.  Eat or drink dairy as a side to meals.  For a 2,000 calorie daily food plan, eat or drink 3 cups of dairy every day.  1 cup is equal to: ? 1 cup milk, yogurt, cottage cheese, or soy milk (soy beverage). ? 2 oz processed cheese. ? 1 oz natural cheese. Fats, oils, salt, and sugars  Only small amounts of oils are recommended.  Avoid foods that are high in calories and low in nutritional value (empty calories), like foods high in fat or added sugars.  Choose foods that are low in salt (sodium). Choose foods that have less than 140 milligrams (mg) of sodium per serving.  Drink water instead of  sugary drinks. Drink enough water each day to keep your urine pale yellow. Where to find support  Work with your health care provider or a nutrition specialist (dietitian) to develop a customized eating plan that is right for you.  Download an app (mobile application) to help you track your daily food intake. Where to find more information  Go to [email or web address] for more information. Summary  MyPlate is a general guideline for healthy eating from the USDA. It is based on the 2010 Dietary Guidelines for Americans.  In general, fruits and vegetables should take up  of your plate, grains should take up  of your plate, and protein should take up  of your  plate. This information is not intended to replace advice given to you by your health care provider. Make sure you discuss any questions you have with your health care provider. Document Revised: 03/04/2019 Document Reviewed: 12/30/2016 Elsevier Patient Education  [place].

## 2019-10-22 LAB — VITAMIN D 25 HYDROXY (VIT D DEFICIENCY, FRACTURES): Vit D, 25-Hydroxy: 38.3 ng/mL (ref 30.0–100.0)

## 2019-10-22 LAB — TSH: TSH: 0.346 u[IU]/mL — ABNORMAL LOW (ref 0.450–4.500)

## 2019-10-25 ENCOUNTER — Ambulatory Visit: Payer: BLUE CROSS/BLUE SHIELD | Attending: Internal Medicine

## 2019-10-25 ENCOUNTER — Other Ambulatory Visit: Payer: BLUE CROSS/BLUE SHIELD

## 2019-10-25 DIAGNOSIS — Z20822 Contact with and (suspected) exposure to covid-19: Secondary | ICD-10-CM

## 2019-10-26 LAB — NOVEL CORONAVIRUS, NAA: SARS-CoV-2, NAA: DETECTED — AB

## 2019-10-31 LAB — SPECIMEN STATUS REPORT

## 2019-10-31 LAB — T4, FREE: Free T4: 1.13 ng/dL (ref 0.93–1.60)

## 2019-10-31 LAB — T3, FREE: T3, Free: 2.9 pg/mL (ref 2.3–5.0)

## 2019-11-05 ENCOUNTER — Ambulatory Visit: Payer: Self-pay

## 2019-11-08 ENCOUNTER — Ambulatory Visit: Payer: Self-pay

## 2019-11-12 ENCOUNTER — Other Ambulatory Visit: Payer: Self-pay

## 2019-11-12 ENCOUNTER — Ambulatory Visit (INDEPENDENT_AMBULATORY_CARE_PROVIDER_SITE_OTHER): Payer: BC Managed Care – PPO | Admitting: *Deleted

## 2019-11-12 DIAGNOSIS — J4550 Severe persistent asthma, uncomplicated: Secondary | ICD-10-CM | POA: Diagnosis not present

## 2019-11-16 ENCOUNTER — Encounter: Payer: Self-pay | Admitting: Family Medicine

## 2019-11-24 ENCOUNTER — Encounter: Payer: Self-pay | Admitting: Family Medicine

## 2019-11-24 ENCOUNTER — Other Ambulatory Visit: Payer: Self-pay

## 2019-11-24 ENCOUNTER — Ambulatory Visit (INDEPENDENT_AMBULATORY_CARE_PROVIDER_SITE_OTHER): Payer: BC Managed Care – PPO | Admitting: Family Medicine

## 2019-11-24 VITALS — Ht 68.0 in

## 2019-11-24 DIAGNOSIS — F32A Depression, unspecified: Secondary | ICD-10-CM

## 2019-11-24 DIAGNOSIS — F329 Major depressive disorder, single episode, unspecified: Secondary | ICD-10-CM

## 2019-11-24 DIAGNOSIS — F321 Major depressive disorder, single episode, moderate: Secondary | ICD-10-CM

## 2019-11-24 DIAGNOSIS — F419 Anxiety disorder, unspecified: Secondary | ICD-10-CM

## 2019-11-24 DIAGNOSIS — F411 Generalized anxiety disorder: Secondary | ICD-10-CM | POA: Diagnosis not present

## 2019-11-24 MED ORDER — CITALOPRAM HYDROBROMIDE 40 MG PO TABS: 40.0000 mg | ORAL_TABLET | Freq: Every day | ORAL | 0 refills | Status: DC

## 2019-11-24 NOTE — Patient Instructions (Signed)
Take 1.5 tablets of the 20mg  citalopram for a week.  If you feel okay (without significant side effects by the end of the week), then increase to 2 tablets.  Once you use up your current supply of the 20mg  tablets, start taking ONE of the new 40mg  tablet prescription.  I encourage you to start counseling.  We will need to follow-up in about 6 weeks. Please contact me if you are having problems before then.   Mindfulness-Based Stress Reduction Mindfulness-based stress reduction (MBSR) is a program that helps people learn to practice mindfulness. Mindfulness is the practice of intentionally paying attention to the present moment. It can be learned and practiced through techniques such as education, breathing exercises, meditation, and yoga. MBSR includes several mindfulness techniques in one program. MBSR works best when you understand the treatment, are willing to try new things, and can commit to spending time practicing what you learn. MBSR training may include learning about:  How your emotions, thoughts, and reactions affect your body.  New ways to respond to things that cause negative thoughts to start (triggers).  How to notice your thoughts and let go of them.  Practicing awareness of everyday things that you normally do without thinking.  The techniques and goals of different types of meditation. What are the benefits of MBSR? MBSR can have many benefits, which include helping you to:  Develop self-awareness. This refers to knowing and understanding yourself.  Learn skills and attitudes that help you to participate in your own health care.  Learn new ways to care for yourself.  Be more accepting about how things are, and let things go.  Be less judgmental and approach things with an open mind.  Be patient with yourself and trust yourself more. MBSR has also been shown to:  Reduce negative emotions, such as depression and anxiety.  Improve memory and focus.  Change how  you sense and approach pain.  Boost your body's ability to fight infections.  Help you connect better with other people.  Improve your sense of well-being. Follow these instructions at home:   Find a local in-person or online MBSR program.  Set aside some time regularly for mindfulness practice.  Find a mindfulness practice that works best for you. This may include one or more of the following: ? Meditation. Meditation involves focusing your mind on a certain thought or activity. ? Breathing awareness exercises. These help you to stay present by focusing on your breath. ? Body scan. For this practice, you lie down and pay attention to each part of your body from head to toe. You can identify tension and soreness and intentionally relax parts of your body. ? Yoga. Yoga involves stretching and breathing, and it can improve your ability to move and be flexible. It can also provide an experience of testing your body's limits, which can help you release stress. ? Mindful eating. This way of eating involves focusing on the taste, texture, color, and smell of each bite of food. Because this slows down eating and helps you feel full sooner, it can be an important part of a weight-loss plan.  Find a podcast or recording that provides guidance for breathing awareness, body scan, or meditation exercises. You can listen to these any time when you have a free moment to rest without distractions.  Follow your treatment plan as told by your health care provider. This may include taking regular medicines and making changes to your diet or lifestyle as recommended. How to practice  mindfulness To do a basic awareness exercise:  Find a comfortable place to sit.  Pay attention to the present moment. Observe your thoughts, feelings, and surroundings just as they are.  Avoid placing judgment on yourself, your feelings, or your surroundings. Make note of any judgment that comes up, and let it go.  Your  mind may wander, and that is okay. Make note of when your thoughts drift, and return your attention to the present moment. To do basic mindfulness meditation:  Find a comfortable place to sit. This may include a stable chair or a firm floor cushion. ? Sit upright with your back straight. Let your arms fall next to your side with your hands resting on your legs. ? If sitting in a chair, rest your feet flat on the floor. ? If sitting on a cushion, cross your legs in front of you.  Keep your head in a neutral position with your chin dropped slightly. Relax your jaw and rest the tip of your tongue on the roof of your mouth. Drop your gaze to the floor. You can close your eyes if you like.  Breathe normally and pay attention to your breath. Feel the air moving in and out of your nose. Feel your belly expanding and relaxing with each breath.  Your mind may wander, and that is okay. Make note of when your thoughts drift, and return your attention to your breath.  Avoid placing judgment on yourself, your feelings, or your surroundings. Make note of any judgment or feelings that come up, let them go, and bring your attention back to your breath.  When you are ready, lift your gaze or open your eyes. Pay attention to how your body feels after the meditation. Where to find more information You can find more information about MBSR from:  Your health care provider.  Community-based meditation centers or programs.  Programs offered near you. Summary  Mindfulness-based stress reduction (MBSR) is a program that teaches you how to intentionally pay attention to the present moment. It is used with other treatments to help you cope better with daily stress, emotions, and pain.  MBSR focuses on developing self-awareness, which allows you to respond to life stress without judgment or negative emotions.  MBSR programs may involve learning different mindfulness practices, such as breathing exercises,  meditation, yoga, body scan, or mindful eating. Find a mindfulness practice that works best for you, and set aside time for it on a regular basis. This information is not intended to replace advice given to you by your health care provider. Make sure you discuss any questions you have with your health care provider. Document Revised: 09/12/2017 Document Reviewed: 02/06/2017 Elsevier Patient Education  [place].

## 2019-11-24 NOTE — Progress Notes (Signed)
Start time: 2:00 End time: 2:28  Virtual Visit via Video Note  I connected with Samantha Cole on 11/24/19 at  2:00 PM EST by a video enabled telemedicine application and verified that I am speaking with the correct person using two identifiers.  Location: Patient: in dorm room (friend in a virtual class in room, feels comfortable talking)  Provider: office   I discussed the limitations of evaluation and management by telemedicine and the availability of in person appointments. The patient expressed understanding and agreed to proceed.  History of Present Illness:  Chief Complaint  Patient presents with  . Follow-up    VIRTUAL-depression follow up to increase dosage on citalopram.    She has been on citalopram since 05/2018.  At her last physical in July she reported that moods were good, felt down "randomly", not related to cycles, and short-lived.  At that time GAD-7 score was 3, and PHQ-9 score was 3.   (Prior to starting meds, PHQ9 score was 14, GAD-7 score was 12).  Last couple of months, maybe since October, she has noted that her moods have gotten worse.  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 She is more fidgety and a little more irritable. She is tired during the day, but has trouble falling asleep.Once she falls asleep, she stays asleep, and get 7-8 hours/night. Feels refreshed when she wakes up. Not known to snore. She states that her anxiety has been okay.  Trying to find a new therapist in [place].  She has been trying to create a routine--schoolwork, gym, relaxing time. Does find some time to see some friends. Denies any other stressors (boyfriend issues or other concerns).  Going to the gym 5 days/week (M-F), lifting weights, no regular cardio.  She had recent COVID (10/2019).  She said that it felt like usual sinus or ear infection.  She had night sweats one night,  which was different.  Never had flare of her asthma, or other symptoms.  Doesn't find any worsening of fatigue, brain fog or other symptoms since her illness.   PMH, PSH, SH reviewed  Outpatient Encounter Medications as of 11/24/2019  Medication Sig Note  . citalopram (CELEXA) 40 MG tablet Take 1 tablet (40 mg total) by mouth daily.   Marland Kitchen loratadine (CLARITIN) 10 MG tablet Take 10 mg by mouth daily.   . Norgestimate-Ethinyl Estradiol Triphasic (TRI-PREVIFEM) 0.18/0.215/0.25 MG-35 MCG tablet Take 1 tablet by mouth daily.   Marland Kitchen triamcinolone (NASACORT) 55 MCG/ACT AERO nasal inhaler SPRAY 2 SPRAYS INTO EACH NOSTRIL EVERY DAY 10/21/2019: Now every 3 weeks  . XOLAIR 150 MG injection INJECT 375 MG UNDER THE SKIN EVERY 14 DAYS   . [DISCONTINUED] citalopram (CELEXA) 20 MG tablet Take 1 tablet (20 mg total) by mouth daily.   Marland Kitchen albuterol (PROVENTIL HFA;VENTOLIN HFA) 108 (90 Base) MCG/ACT inhaler Inhale 2 puffs into the lungs as needed. Reported on 11/29/2015 (Patient not taking: Reported on 04/21/2019) 05/12/2019: Prn   . [DISCONTINUED] azithromycin (ZITHROMAX) 250 MG tablet Take 2 tablets by mouth on first day, then 1 tablet by mouth on days 2 through 5    Facility-Administered Encounter Medications as of 11/24/2019  Medication  . omalizumab Geoffry Paradise) injection 375 mg   (taking 20mg  of citalopram prior to today's visit, not 40).  Allergies  Allergen Reactions  . Other Anaphylaxis    Tree nuts, Coconut, Sesame  . Shellfish Allergy Anaphylaxis  . Lactose  Intolerance (Gi)     ROS: no fever, chills, URI symptoms (all resolved). Breathing is at baseline. Needed to take benadryl once when cheese was put on her sandwich by mistake, otherwise no allergic reactions. See HPI--fatigue, worsening depression, some anxiety, trouble falling asleep.   Observations/Objective:  Ht 5\' 8"  (1.727 m)   LMP 11/18/2019 (Exact Date)   BMI 25.48 kg/m   Well-appearing, pleasant female, in good spirits. She is alert,  oriented. Cranial nerves are grossly intact Depressed mood; full range of affect. Normal grooming. Normal eye contact and speech  GAD-7 score of 8 PHQ-9 score of 17  Chart reviewed--including review of recent visits and lab results Normal Vit D-OH (38.3), Lab Results  Component Value Date   TSH 0.346 (L) 10/21/2019   Free T3 and free T4 were normal   Assessment and Plan:  Depression, major, single episode, moderate (HCC) - PHQ-9 score of 17; Will titrate up to 40mg  citalopram (taking 30mg  for a week); risks/SE reviewed. Encouraged counseling  Anxiety and depression - doing well on citalopram, continue. If worsening at school, encouraged her to contact therapist (at home, or school counselor), as well as [place] - Plan: citalopram (CELEXA) 40 MG tablet  Generalized anxiety disorder  Counseled re: stress reduction, sleep hygiene, relaxation techniques. Encouraged cardio at gym, not just weights. Risks/SE of medications reviewed. Encouraged her to start counseling (if doesn't find therapist in [place], can see if she can do virtual visits with prior therapist in [place]).    Follow Up Instructions:    I discussed the assessment and treatment plan with the patient. The patient was provided an opportunity to ask questions and all were answered. The patient agreed with the plan and demonstrated an understanding of the instructions.   The patient was advised to call back or seek an in-person evaluation if the symptoms worsen or if the condition fails to improve as anticipated.  I provided 28 minutes of video face-to-face time during this encounter. More than 1/2 of the visit was spent counseling. An additional 10 minutes was spent reviewing records and documentation of visit.   Vikki Ports, MD

## 2019-11-30 NOTE — Progress Notes (Signed)
Pt is doing Virtual March the 24h

## 2019-12-03 ENCOUNTER — Other Ambulatory Visit: Payer: Self-pay

## 2019-12-03 ENCOUNTER — Ambulatory Visit (INDEPENDENT_AMBULATORY_CARE_PROVIDER_SITE_OTHER): Payer: BC Managed Care – PPO | Admitting: *Deleted

## 2019-12-03 DIAGNOSIS — J4550 Severe persistent asthma, uncomplicated: Secondary | ICD-10-CM

## 2019-12-08 DIAGNOSIS — J060 Acute laryngopharyngitis: Secondary | ICD-10-CM | POA: Diagnosis not present

## 2019-12-24 ENCOUNTER — Ambulatory Visit: Payer: Self-pay

## 2020-01-03 ENCOUNTER — Other Ambulatory Visit: Payer: Self-pay

## 2020-01-03 ENCOUNTER — Ambulatory Visit (INDEPENDENT_AMBULATORY_CARE_PROVIDER_SITE_OTHER): Payer: BC Managed Care – PPO

## 2020-01-03 DIAGNOSIS — J4550 Severe persistent asthma, uncomplicated: Secondary | ICD-10-CM

## 2020-01-04 NOTE — Progress Notes (Signed)
Start time: End time:  Virtual Visit via Video Note  I connected with Samantha Cole on 01/05/20 by a video enabled telemedicine application and verified that I am speaking with the correct person using two identifiers.  Location: Patient:  Provider: office   I discussed the limitations of evaluation and management by telemedicine and the availability of in person appointments. The patient expressed understanding and agreed to proceed.  History of Present Illness:  Patient presents for 6 week follow-up on depression, since increasing citalopram from 20 to 40mg .  She has been on citalopram since 05/2018.  At her last physical in July she reported that moods were good, felt down "randomly", not related to cycles, and short-lived.  At that time GAD-7 score was 3, and PHQ-9 score was 3.  (Prior to starting meds, PHQ9 score was 14, GAD-7 score was 12). 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 She was more fidgety and a little more irritable. She was having trouble falling asleep (but able to get 7-8 hours/night), and tired during the day. She had been trying to create a routine--schoolwork, gym, relaxing time, even seeing some friends.  She had denied other stressors (no issues with boyfriend or other concerns). She was lifting weights at the gym 5 days/week (no cardio). GAD-7 score was 8 and PHQ-9 score was 17 in 11/2019, when on 20mg  citalopram.  She was encouraged to find a therapist. We increased the dose to 40mg  at that visit, and presents for follow-up today.  PMH, PSH, SH reviewed   ROS:  No fever, chills, headaches, dizziness, chest pain, shortness of breath, URI symptoms.  No nausea, vomiting, bowel changes. Weight change? Moods per HPI.   Observations/Objective:    Assessment and Plan:   Follow Up  Instructions:    I discussed the assessment and treatment plan with the patient. The patient was provided an opportunity to ask questions and all were answered. The patient agreed with the plan and demonstrated an understanding of the instructions.   The patient was advised to call back or seek an in-person evaluation if the symptoms worsen or if the condition fails to improve as anticipated.  I provided   minutes of non-face-to-face time during this encounter.   12/2019, MD

## 2020-01-05 ENCOUNTER — Ambulatory Visit: Payer: BC Managed Care – PPO | Admitting: Family Medicine

## 2020-01-05 ENCOUNTER — Other Ambulatory Visit: Payer: Self-pay

## 2020-01-05 ENCOUNTER — Telehealth: Payer: Self-pay | Admitting: *Deleted

## 2020-01-05 NOTE — Telephone Encounter (Signed)
This was a confirmed appt Suzette Battiest spoke with her). Needs no show letter, and needs to be rescheduled for f/u depression.

## 2020-01-05 NOTE — Telephone Encounter (Signed)

## 2020-01-06 ENCOUNTER — Encounter: Payer: Self-pay | Admitting: Family Medicine

## 2020-01-06 NOTE — Progress Notes (Signed)
   VISIT HAD BEEN CONFIRMED. SHE HAD BEEN CHECKED IN BY FRONT STAFF, WHICH IS WHY IT ISN'T SHOWING AS NO SHOW.  SHE WAS NOT SEEN TODAY.  SHE WAS AT WORK, AND DIDN'T ANSWER PHONE CALLS AT APPOINTMENT TIME IN ORDER TO CHECK HER IN FOR THE APPOINTMENT, AND DAD CALLED LETTING us KNOW SHE WAS AT WORK AND WASN'T GOING TO PARTICIPATE IN THE APPOINTMENT, WOULD CALL BACK TO RESCHEDULE.   Other note was what was prepped (pre-charted) in advance of her visit.

## 2020-01-06 NOTE — Telephone Encounter (Signed)
Sent letter pt dad said she would call back to make appt

## 2020-01-07 DIAGNOSIS — Z23 Encounter for immunization: Secondary | ICD-10-CM | POA: Diagnosis not present

## 2020-01-24 ENCOUNTER — Ambulatory Visit: Payer: Self-pay

## 2020-01-24 ENCOUNTER — Ambulatory Visit (INDEPENDENT_AMBULATORY_CARE_PROVIDER_SITE_OTHER): Payer: BC Managed Care – PPO

## 2020-01-24 ENCOUNTER — Other Ambulatory Visit: Payer: Self-pay

## 2020-01-24 DIAGNOSIS — J4550 Severe persistent asthma, uncomplicated: Secondary | ICD-10-CM | POA: Diagnosis not present

## 2020-02-07 DIAGNOSIS — Z23 Encounter for immunization: Secondary | ICD-10-CM | POA: Diagnosis not present

## 2020-02-14 ENCOUNTER — Ambulatory Visit: Payer: Self-pay

## 2020-02-28 ENCOUNTER — Ambulatory Visit (INDEPENDENT_AMBULATORY_CARE_PROVIDER_SITE_OTHER): Payer: BC Managed Care – PPO

## 2020-02-28 ENCOUNTER — Telehealth: Payer: Self-pay | Admitting: *Deleted

## 2020-02-28 ENCOUNTER — Other Ambulatory Visit: Payer: Self-pay

## 2020-02-28 DIAGNOSIS — J4550 Severe persistent asthma, uncomplicated: Secondary | ICD-10-CM | POA: Diagnosis not present

## 2020-02-28 NOTE — Telephone Encounter (Signed)
error 

## 2020-03-02 ENCOUNTER — Encounter: Payer: Self-pay | Admitting: Family Medicine

## 2020-03-02 ENCOUNTER — Other Ambulatory Visit: Payer: Self-pay | Admitting: Family Medicine

## 2020-03-02 ENCOUNTER — Telehealth (INDEPENDENT_AMBULATORY_CARE_PROVIDER_SITE_OTHER): Payer: BC Managed Care – PPO | Admitting: Family Medicine

## 2020-03-02 VITALS — Temp 97.2°F | Ht 68.0 in | Wt 169.0 lb

## 2020-03-02 DIAGNOSIS — F32A Depression, unspecified: Secondary | ICD-10-CM

## 2020-03-02 DIAGNOSIS — F419 Anxiety disorder, unspecified: Secondary | ICD-10-CM

## 2020-03-02 DIAGNOSIS — F325 Major depressive disorder, single episode, in full remission: Secondary | ICD-10-CM

## 2020-03-02 DIAGNOSIS — F329 Major depressive disorder, single episode, unspecified: Secondary | ICD-10-CM

## 2020-03-02 MED ORDER — CITALOPRAM HYDROBROMIDE 40 MG PO TABS: 40.0000 mg | ORAL_TABLET | Freq: Every day | ORAL | 0 refills | Status: DC

## 2020-03-02 NOTE — Progress Notes (Signed)
Start time: 12:14 End time: 12:29  Virtual Visit via Video Note  I connected withHannah Cole on 03/02/20 by a video enabled telemedicine application and verified that I am speaking with the correct person using two identifiers.  Location: Patient: home Provider: office  I discussed the limitations of evaluation and management by telemedicine and the availability of in person appointments. The patient expressed understanding and agreed to proceed.  History of Present Illness:  Chief Complaint  Patient presents with  . Med check    med check for citalopram. No new concerns.     Patient presents for follow-up on depression, since increasing citalopram from 20 to 39m (she no-showed her March f/u visit).  She has been on citalopram since 05/2018. At her last physical in July she reported that moods were good, felt down "randomly", not related to cycles, and short-lived. At that time GAD-7 score was 3, and PHQ-9 score was 3. (Prior to starting meds,PHQ9 scorewas14, GAD-7 scorewas12). 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 She was more fidgety and a little more irritable. She was having trouble falling asleep (but able to get 7-8 hours/night), and tired during the day. She had been trying to create a routine--schoolwork, gym, relaxing time, even seeing some friends.  She had denied other stressors (no issues with boyfriend or other concerns). GAD-7 score was 8 and PHQ-9 score was 17 in 11/2019, when on 260mcitalopram. She was encouraged to find a therapist. We increased the dose to 4060mt that visit, and presents for follow-up today.  She was able to see a therapist at school, met with her through the end of the semester. Thinks she will be okay without counseling over the summer.  She denies any  side effects at the 57m72mtalopram dose. Wants to spend time with friends, focuses better, and is sleeping better. She is pleased with the improvement at the higher dose.    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 citalopram (CELEXA) 40 MG tablet Take 1 tablet (40 mg total) by mouth daily.   . loMarland Kitchenatadine (CLARITIN) 10 MG tablet Take 10 mg by mouth daily.   . Norgestimate-Ethinyl Estradiol Triphasic (TRI-PREVIFEM) 0.18/0.215/0.25 MG-35 MCG tablet Take 1 tablet by mouth daily.   . trMarland Kitchenamcinolone (NASACORT) 55 MCG/ACT AERO nasal inhaler SPRAY 2 SPRAYS INTO EACH NOSTRIL EVERY DAY 10/21/2019: Now every 3 weeks  . XOLAIR 150 MG injection INJECT 375 MG UNDER THE SKIN EVERY 14 DAYS   . albuterol (PROVENTIL HFA;VENTOLIN HFA) 108 (90 Base) MCG/ACT inhaler Inhale 2 puffs into the lungs as needed. Reported on 11/29/2015 (Patient not taking: Reported on 04/21/2019) 05/12/2019: Prn    Facility-Administered Encounter Medications as of 03/02/2020  Medication  . omalizumab (XOLArvid Rightjection 375 mg   Allergies  Allergen Reactions  . Other Anaphylaxis    Tree nuts, Coconut, Sesame  . Shellfish Allergy Anaphylaxis  . Lactose Intolerance (Gi)     ROS:  No fever, chills, headaches, dizziness, chest pain, shortness of breath, URI symptoms. Breathing/asthma is controlled No nausea, vomiting, bowel changes, no sexual side effects (normal libido). Weight stable, moods better. Reports using condoms in addition to her OCP's.   Observations/Objective:  Temp (!) 97.2 F (36.2 C) (Oral)  Ht _0  (1.727 m)   Wt 169 lb (76.7 kg)   LMP 02/10/2020 (Exact Date)   BMI 25.70 kg/m   Wt Readings from Last 3 Encounters:  03/02/20 169 lb (76.7 kg) (91 %, Z= 1.37)*  10/21/19 167 lb 9.6 oz (76 kg) (91 %, Z= 1.36)*  05/12/19 157 lb 9.6 oz (71.5 kg) (87 %, Z= 1.15)*   * Growth percentiles are based on CDC  (Girls, 2-20 Years) data.   Pleasant, well-appearing female, in no distress She is in good spirits. Normal grooming, eye contact and speech.  Normal mood and affect  PHQ-9 score of 2 GAD-7 score of 0  Assessment and Plan:  Anxiety and depression - Well controlled on citalopram 39m, having gotten therapy as well, during the year. - Plan: citalopram (CELEXA) 40 MG tablet  Depression, major, in remission ([place]  Continue 459m F/u as scheduled for CPE in July, at which time we can give year suppy of citalopram (and OCPs)   Follow Up Instructions:   I discussed the assessment and treatment plan with the patient. The patient was provided an opportunity to ask questions and all were answered. The patient agreed with the plan and demonstrated an understanding of the instructions.  The patient was advised to call back or seek an in-person evaluation if the symptoms worsen or if the condition fails to improve as anticipated.  I provided 15 minutes of non-face-to-face time during this encounter.   EvVikki PortsMD

## 2020-03-02 NOTE — Telephone Encounter (Signed)
Is this okay to fill for #30 with 1 refill to lat until July visit or do you want her to schedule virtual med check?

## 2020-03-02 NOTE — Telephone Encounter (Signed)
No--she no showed her virtual f/u in March (remember she was working?) Needs a med check now, can be virtual

## 2020-03-09 ENCOUNTER — Ambulatory Visit: Payer: BC Managed Care – PPO | Admitting: Allergy

## 2020-03-16 ENCOUNTER — Other Ambulatory Visit: Payer: Self-pay

## 2020-03-16 ENCOUNTER — Ambulatory Visit (INDEPENDENT_AMBULATORY_CARE_PROVIDER_SITE_OTHER): Payer: BC Managed Care – PPO | Admitting: Allergy

## 2020-03-16 ENCOUNTER — Encounter: Payer: Self-pay | Admitting: Allergy

## 2020-03-16 VITALS — BP 108/62 | HR 73 | Temp 98.2°F | Resp 16 | Ht 66.73 in | Wt 177.0 lb

## 2020-03-16 DIAGNOSIS — J4550 Severe persistent asthma, uncomplicated: Secondary | ICD-10-CM | POA: Diagnosis not present

## 2020-03-16 DIAGNOSIS — J301 Allergic rhinitis due to pollen: Secondary | ICD-10-CM

## 2020-03-16 DIAGNOSIS — H1013 Acute atopic conjunctivitis, bilateral: Secondary | ICD-10-CM | POA: Diagnosis not present

## 2020-03-16 DIAGNOSIS — T7800XD Anaphylactic reaction due to unspecified food, subsequent encounter: Secondary | ICD-10-CM | POA: Diagnosis not present

## 2020-03-16 MED ORDER — EPINEPHRINE 0.3 MG/0.3ML IJ SOAJ: 0.3000 mg | Freq: Once | INTRAMUSCULAR | 1 refills | Status: AC

## 2020-03-16 MED ORDER — AZELASTINE-FLUTICASONE 137-50 MCG/ACT NA SUSP: 1.0000 | Freq: Two times a day (BID) | NASAL | 5 refills | Status: DC

## 2020-03-16 NOTE — Progress Notes (Signed)
Follow-up Note  RE: Samantha Cole MRN: [id number] DOB: [birth year] Date of Office Visit: 03/16/2020   History of present illness: Samantha Cole is a 19 y.o. female presenting today for follow-up of asthma, anaphylaxis due to food and allergic rhinitis.  She was last seen on 03/17/2019 by myself.  She states she has been doing well since her last visit.  She states her asthma has been under good control with xolair every 3 weeks.  She states she has not needed to use her albuterol.  She has not had any nighttime awakenings.  She has not required any ED or urgent care visits or any systemic steroids. She continues to avoid dairy, shellfish, fish, peanuts, tree nuts, sesame seed, apple and mangoes.  She has not had any accidental ingestions or need to use her epinephrine device. With pollen season she states she has been having some congestion as well as drainage, sneezing and itchy eyes as well as a sore throat.  She is using Claritin currently.  He was on Allegra before but ran out and states the Claritin and the [place] worked equally well.  She is using Nasacort for nasal congestion control.  She is using Optivar daily as needed for her itchy eyes.  Review of systems: Review of Systems  Constitutional: Negative.   HENT:       See HPI  Eyes:       See HPI  Respiratory: Negative.   Cardiovascular: Negative.   Gastrointestinal: Negative.   Musculoskeletal: Negative.   Skin: Negative.   Neurological: Negative.     All other systems negative unless noted above in HPI  Past medical/social/surgical/family history have been reviewed and are unchanged unless specifically indicated below.  [facility] state  Medication List: Current Outpatient Medications  Medication Sig Dispense Refill  . albuterol (PROVENTIL HFA;VENTOLIN HFA) 108 (90 Base) MCG/ACT inhaler Inhale 2 puffs into the lungs as needed. Reported on 11/29/2015 18 g 1  . citalopram (CELEXA) 40 MG tablet Take 1 tablet (40 mg total)  by mouth daily. 90 tablet 0  . loratadine (CLARITIN) 10 MG tablet Take 10 mg by mouth daily.    . Norgestimate-Ethinyl Estradiol Triphasic (TRI-PREVIFEM) 0.18/0.215/0.25 MG-35 MCG tablet Take 1 tablet by mouth daily. 3 Package 3  . triamcinolone (NASACORT) 55 MCG/ACT AERO nasal inhaler SPRAY 2 SPRAYS INTO EACH NOSTRIL EVERY DAY 1 Inhaler 5  . XOLAIR 150 MG injection INJECT 375 MG UNDER THE SKIN EVERY 14 DAYS 6 each 12  . Azelastine-Fluticasone 137-50 MCG/ACT SUSP Place 1 spray into the nose in the morning and at bedtime. 23 g 5  . EPINEPHrine (AUVI-Q) 0.3 mg/0.3 mL IJ SOAJ injection Inject 0.3 mLs (0.3 mg total) into the muscle once for 1 dose. As directed for life-threatening allergic reactions 2 each 1   Current Facility-Administered Medications  Medication Dose Route Frequency Provider Last Rate Last Admin  . omalizumab Arvid Right) injection 375 mg  375 mg Subcutaneous Q14 Days Kennith Gain, MD   375 mg at 02/28/20 1138     Known medication allergies: Allergies  Allergen Reactions  . Other Anaphylaxis    Tree nuts, Coconut, Sesame  . Shellfish Allergy Anaphylaxis  . Lactose Intolerance (Gi)      Physical examination: Blood pressure 108/62, pulse 73, temperature 98.2 F (36.8 C), temperature source Temporal, resp. rate 16, height 5' 6.73" (1.695 m), weight 177 lb (80.3 kg), SpO2 98 %.  General: Alert, interactive, in no acute distress. HEENT: PERRLA, TMs pearly  gray, turbinates moderately edematous with clear discharge, post-pharynx non erythematous. Neck: Supple without lymphadenopathy. Lungs: Clear to auscultation without wheezing, rhonchi or rales. {no increased work of breathing. CV: Normal S1, S2 without murmurs. Abdomen: Nondistended, nontender. Skin: Warm and dry, without lesions or rashes. Extremities:  No clubbing, cyanosis or edema. Neuro:   Grossly intact.  Diagnositics/Labs:  Spirometry: FEV1: 3.15L 89%, FVC: 4.1L 101%, ratio consistent with  nonobstructive pattern  Assessment and plan:   Severe persistent asthma   Under good control  Continue Xolair injections every 3 weeks.    Have access to albuterol inhaler 2 puffs every 4-6 hours as needed for cough/wheeze/shortness of breath/chest tightness.  May use 15-20 minutes prior to activity.   Monitor frequency of use.    Anaphylaxis due to food Last skin testing from June 2019 was negative which means that Xolair is having some protective effects against your food allergies    Continue avoidance of milk, dairy, shellfish, fish, peanut/tree nuts, coconut, sesame seed, apple, and mango    In case of an allergic reaction, give Benadryl 4 teaspoonfuls every 6 hours, and life-threatening  symptoms occur, inject with EpiPen 0.3 mg    follow emergency action plan in case of allergic reaction  Allergic rhinitis with conjunctivitis  Continue avoidance measures for dust mites, cat, dog, grass pollens, tree pollens,weed pollens and  cockroach  Continue with Claritin 10mg  daily.  Trial Allegra 180mg  to see if this is more effective than Claritin.     Hold Nasacort  Start Dymista 1 spray each nostril twice a day.  This is a combination nasal spray with Flonase + Astelin (nasal antihistamine).  This helps with both nasal congestion and drainage.   Continue Optivar eye drops 1 drop each eye twice a day as needed  Return in about 6-9 months or sooner if needed  I appreciate the opportunity to take part in Roshaunda's care. Please do not hesitate to contact me with questions.  Sincerely,   , MD Allergy/Immunology [facility]

## 2020-03-16 NOTE — Patient Instructions (Signed)
Severe persistent asthma   Under good control  Continue Xolair injections every 3 weeks.    Have access to albuterol inhaler 2 puffs every 4-6 hours as needed for cough/wheeze/shortness of breath/chest tightness.  May use 15-20 minutes prior to activity.   Monitor frequency of use.    Food Allergy Last skin testing from June 2019 was negative which means that Xolair is having some protective effects against your food allergies    Continue avoidance of milk, dairy, shellfish, fish, peanut/tree nuts, coconut, sesame seed, apple, and mango    In case of an allergic reaction, give Benadryl 4 teaspoonfuls every 6 hours, and life-threatening  symptoms occur, inject with EpiPen 0.3 mg    follow emergency action plan in case of allergic reaction  Allergic rhinitis  Continue avoidance measures for dust mites, cat, dog, grass pollens, tree pollens,weed pollens and  cockroach  Continue with Claritin 10mg  daily.  Trial Allegra 180mg  to see if this is more effective than Claritin.     Hold Nasacort  Start Dymista 1 spray each nostril twice a day.  This is a combination nasal spray with Flonase + Astelin (nasal antihistamine).  This helps with both nasal congestion and drainage.   Continue Optivar eye drops 1 drop each eye twice a day as needed  Return in about 6-9 months or sooner if needed

## 2020-03-21 ENCOUNTER — Ambulatory Visit: Payer: Self-pay

## 2020-04-09 DIAGNOSIS — J029 Acute pharyngitis, unspecified: Secondary | ICD-10-CM | POA: Diagnosis not present

## 2020-04-23 NOTE — Progress Notes (Deleted)
Samantha Cole is a 19 y.o. female who presents for a complete physical.   She has the following concerns:  Anxiety and Depression:She has been on citalopram since 05/2018. He dose was increased from 19m to 423min 11/2019 due to worsening depression, irritability.  GAD-7 scorewas8 andPHQ-9 scorewas17 prior to the increase in dose.  She was able to see a therapist at school, and reported significant improvement at her f/u visit in 02/2020.  She was enjoying spending time with friends, focus was improved, as was her sleep.  She denied any side effects to the medication.  At 02/2020 visit, PHQ-9 score was 2, GAD-7 score was 0.  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 She is on OCP's for cramps and acne.  Menstrual cramps are significantly better, only occur one day of the cycle.  Periods remain light and shorter.   Menarche age [age]monthly. Not heavy(on OCP's). Uses ibuprofen as needed for cramps  Acne.She has been on OCP's since 12/2016. Using [employer]rology skin products, which is helping with her acne. (previously used rx Differin.)  She had a wart treated at her R knee last year.  Denies any recurrence UPDATE  She has asthma andallergies, [facility]. She is on Xolair q3 weeks and doing well (changed to q 3 weeks). Needs albuterol only rarely. Uses claritin as needed for allergies, and nasal steroid only prn congestion.  She has tree-nut and shellfish allergy. She has anAuvi-Q pen.   Immunization History  Administered Date(s) Administered  . DTaP 10/30/2000, 05/19/2001, 12/10/2002, 06/19/2004, 01/23/2006  . HPV 9-valent 07/22/2018, 04/21/2019  . HPV Quadrivalent 01/14/2019  . Hepatitis A 12/10/2002, 06/19/2004  . Hepatitis B 1006/20/0111/15/2001, 03/06/2001  . HiB (PRP-OMP) 10/30/2000, 01/01/2001, 03/06/2001, 12/10/2002  . IPV 01/01/2001, 03/06/2001, 04/29/2001, 01/23/2006   . Influenza Split 08/10/2012  . Influenza,inj,Quad PF,6+ Mos 09/15/2013, 08/17/2014, 09/04/2015, 08/12/2016, 08/15/2017, 07/22/2018, 07/20/2019  . MMR 12/10/2002, 01/23/2006  . Meningococcal B, OMV 11/10/2017, 04/21/2019  . Meningococcal Conjugate 03/29/2015  . Meningococcal Mcv4o 11/10/2017  . Pneumococcal-Unspecified 10/30/2000, 06/19/2004  . Tdap 06/09/2012   She had c-met, CBC, TSH and Vitamin D in 04/2018.  Vit D-OH was normal at 60.1 (previously had been slightly low at 29) Vitamin D-OH was repeated 10/2019, and level was 38.3 She had thyroid tests repeated in 10/2019 (concern over weight gain)--TSH slightly low, normal free T3, and free T4 Lab Results  Component Value Date   TSH 0.346 (L) 10/21/2019   Lipids: Lab Results  Component Value Date   CHOL 146 03/29/2015   HDL 72 03/29/2015   LDLCALC 53 03/29/2015   TRIG 105 03/29/2015   CHOLHDL 2.0 03/29/2015   Last Pap smear: never Dentist: twice a year Ophtho: every 1-2 years (has glasses/contacts) Exercise:     PMH, PSH, SH and FH reviewed and updated    ROS:No fever, chills, headaches, dizziness, fainting spells, URI symptoms ,cough, shortness of breath, chest pain, palpitations, nausea, vomiting, bowel changes, urinary complaints. Periods are regular. No bleeding, bruising, rash, joint pains or other concerns.Acne is well controlled. Moods are controlled. Asthma and allergies are controlled. Weight   PHYSICAL EXAM:  Wt Readings from Last 3 Encounters:  03/16/20 177 lb (80.3 kg) (94 %, Z= 1.54)*  03/02/20 169 lb (76.7 kg) (91 %, Z= 1.37)*  10/21/19 167 lb 9.6 oz (76 kg) (91 %, Z= 1.36)*   * Growth percentiles are  based on CDC (Girls, 2-20 Years) data.   Well developed, pleasant, cooperative female in no distress HEENT: PERRL, EOMI, conjunctiva clear. TM's and EAC's normal. Nose and mouth not examined, wearing mask due to COVID-19 pandemic. Neck: no lymphadenopathy, thyromegaly or mass Heart: regular rate  and rhythm without murmur Lungs: clear bilaterally. Good air movement. No wheezes, rales, ronchi Abdomen: soft, nontender, no organomegaly or mass Back: no spinal tenderness, scoliosis or CVA tenderness Breast exam: no nipple discharge, inversion, no skin dimpling, breast masses or axillary lymphadenopathy Pelvic deferred Extremities: no clubbing, cyanosis or edema. 2+ pulses Skin: no rashes, suspicious lesions or bruising.  Psych: normal mood, affect, hygiene and grooming Neuro: alert and oriented. Normal strength, sensation, gait, and DTR's 2+ and symmetric.   ASSESSMENT/PLAN:  Urine dip Urine chlamydia  DID SHE GET COVID VACCINE? (if so, enter dates)  Citalopram last RF #90 in 02/2020. OCP's due for year refill.

## 2020-04-24 ENCOUNTER — Encounter: Payer: 59 | Admitting: Family Medicine

## 2020-04-25 ENCOUNTER — Other Ambulatory Visit: Payer: Self-pay

## 2020-04-25 ENCOUNTER — Ambulatory Visit (INDEPENDENT_AMBULATORY_CARE_PROVIDER_SITE_OTHER): Payer: BC Managed Care – PPO

## 2020-04-25 DIAGNOSIS — J4550 Severe persistent asthma, uncomplicated: Secondary | ICD-10-CM

## 2020-05-07 ENCOUNTER — Other Ambulatory Visit: Payer: Self-pay | Admitting: Family Medicine

## 2020-05-07 DIAGNOSIS — L700 Acne vulgaris: Secondary | ICD-10-CM

## 2020-05-08 ENCOUNTER — Other Ambulatory Visit: Payer: Self-pay | Admitting: *Deleted

## 2020-05-08 DIAGNOSIS — L700 Acne vulgaris: Secondary | ICD-10-CM

## 2020-05-08 MED ORDER — NORGESTIM-ETH ESTRAD TRIPHASIC 0.18/0.215/0.25 MG-35 MCG PO TABS: 1.0000 | ORAL_TABLET | Freq: Every day | ORAL | 0 refills | Status: DC

## 2020-05-09 NOTE — Progress Notes (Signed)
Chief Complaint  Patient presents with  . Annual Exam    nonfasting annual exam. Patient could not give urine, will give on way out (given water). Gets eyes checked yearly at eye doctor for contacts. Had both COVID vaccines.     Samantha Cole is a 19 y.o. female who presents for a complete physical.  She cancelled her CPE same day recently, rescheduled when OCP refill needed. She is doing well and has no concerns.  Anxiety and Depression: She has been on citalopram since 05/2018. He dose was increased from 40m to 416min 11/2019 due to worsening depression, irritability. GAD-7 scorewas8 andPHQ-9 scorewas17 prior to the increase in dose. She was able to see a therapist at school, and reported significant improvement at her f/u visit in 02/2020.  She denies any side effects to the medication.  At 02/2020 visit, PHQ-9 score was 2, GAD-7 score was 0.  Today she reports that she continues to do well on this dose, no side effects.  Only meets with therapist during the school year.  [place]18. 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  Interested in STD check. She is also on OCP's for cramps and acne. Cycles are normal, denies cramps. Periods remain light and shorter. Denies nausea, migraines, other side effects. Acne remains well controlled, using OCP's (since 12/2016) and [employer] skin products. Menarche age [age]She has asthma andallergies, under the care of Asthma and AlBas[place]he is on Xolairq3weeks and doing well (changed to q 3 weeks).Can't recall the last time she needed albuterol. Uses claritin as needed for allergies, just recently started new nasal spray (previously was on Nasacort).  She has tree-nut and shellfish allergy. She has anAuvi-Q pen.  Immunization History  Administered Date(s) Administered  . DTaP 10/30/2000, 05/19/2001, 12/10/2002, 06/19/2004, 01/23/2006   . HPV 9-valent 07/22/2018, 04/21/2019  . HPV Quadrivalent 01/14/2019  . Hepatitis A 12/10/2002, 06/19/2004  . Hepatitis B [id number]/15/2001, 03/06/2001  . HiB (PRP-OMP) 10/30/2000, 01/01/2001, 03/06/2001, 12/10/2002  . IPV 01/01/2001, 03/06/2001, 04/29/2001, 01/23/2006  . Influenza Split 08/10/2012  . Influenza,inj,Quad PF,6+ Mos 09/15/2013, 08/17/2014, 09/04/2015, 08/12/2016, 08/15/2017, 07/22/2018, 07/20/2019  . MMR 12/10/2002, 01/23/2006  . Meningococcal B, OMV 11/10/2017, 04/21/2019  . Meningococcal Conjugate 03/29/2015  . Meningococcal Mcv4o 11/10/2017  . Moderna SARS-COVID-2 Vaccination 01/07/2020, 02/07/2020  . Pneumococcal-Unspecified 10/30/2000, 06/19/2004  . Tdap 06/09/2012    She hadc-met,CBC,TSH andVitamin Din 04/2018. Vit D-OH was normal at 60.1(previously had been slightly low at 29) Vitamin D-OH was repeated 10/2019, and level was 38.3 She had thyroid tests repeated in 10/2019 (concern over weight gain)--TSH slightly low, normal free T3, and free T4 Lab Results  Component Value Date   TSH 0.346 (L) 10/21/2019   Lab Results  Component Value Date   CHOL 146 03/29/2015   HDL 72 03/29/2015   LDLCALC 53 03/29/2015   TRIG 105 03/29/2015   CHOLHDL 2.0 03/29/2015   Last Pap smear:never Dentist:twice a year Ophtho:yearly Exercise: gym 5x/week--runs a mile on treadmill, weights   PMH, PSH, SH and FH reviewed and updated  Outpatient Encounter Medications as of 05/10/2020  Medication Sig Note  . Azelastine-Fluticasone 137-50 MCG/ACT SUSP Place 1 spray into the nose in the morning and at bedtime.   . citalopram (CELEXA) 40 MG tablet Take 1 tablet (40 mg total) by mouth daily.   . Marland Kitchenoratadine (CLARITIN) 10 MG tablet Take 10 mg by mouth daily.   .Marland Kitchen  Multiple Vitamin (MULTIVITAMIN) tablet Take 1 tablet by mouth daily. 05/10/2020: Women's gummy; takes about 4x/week  . Norgestimate-Ethinyl Estradiol Triphasic (TRI-ESTARYLLA) 0.18/0.215/0.25 MG-35 MCG tablet Take 1  tablet by mouth daily.   Arvid Right 150 MG injection INJECT 375 MG UNDER THE SKIN EVERY 14 DAYS   . albuterol (PROVENTIL HFA;VENTOLIN HFA) 108 (90 Base) MCG/ACT inhaler Inhale 2 puffs into the lungs as needed. Reported on 11/29/2015 (Patient not taking: Reported on 05/10/2020) 05/12/2019: Prn   . [DISCONTINUED] triamcinolone (NASACORT) 55 MCG/ACT AERO nasal inhaler SPRAY 2 SPRAYS INTO EACH NOSTRIL EVERY DAY 10/21/2019: Now every 3 weeks   Facility-Administered Encounter Medications as of 05/10/2020  Medication  . omalizumab Arvid Right) injection 375 mg   Allergies  Allergen Reactions  . Other Anaphylaxis    Tree nuts, Coconut, Sesame  . Shellfish Allergy Anaphylaxis  . Lactose Intolerance (Gi)     VCB:SWHQPRF, chills,headaches, dizziness, fainting spells, URI symptoms ,cough, shortness of breath, chest pain, palpitations, nausea, vomiting, bowel changes, urinary complaints. Periods are regular. No bleeding, bruising, rash, joint pains or other concerns.Acne is well controlled. Moods are controlled. Asthma and allergies are controlled. Weight has been stable.   PHYSICAL EXAM:  BP 110/70   Pulse 72   Ht '5\' 8"'  (1.727 m)   Wt 164 lb 12.8 oz (74.8 kg)   LMP 04/29/2020   BMI 25.06 kg/m   Wt Readings from Last 3 Encounters:  05/10/20 164 lb 12.8 oz (74.8 kg) (90 %, Z= 1.26)*  03/16/20 177 lb (80.3 kg) (94 %, Z= 1.54)*  03/02/20 169 lb (76.7 kg) (91 %, Z= 1.37)*   * Growth percentiles are based on CDC (Girls, 2-20 Years) data.     Well developed, pleasant, cooperative female in no distress HEENT: PERRL, EOMI, conjunctiva clear. TM's and EAC's normal.Nose and mouth not examined, wearing mask due to COVID-19 pandemic. Neck: no lymphadenopathy, thyromegaly or mass Heart: regular rate and rhythm without murmur Lungs: clear bilaterally. Good air movement. No wheezes, rales, ronchi Abdomen: soft, nontender, no organomegaly or mass Back: no spinal tenderness, scoliosis or CVA  tenderness Breastexam:no nipple discharge, inversion, no skin dimpling, breast masses or axillary lymphadenopathy Pelvic deferred Extremities: no clubbing, cyanosis or edema. 2+ pulses Skin: no rashes, suspicious lesions or bruising. Psych: normal mood, affect, hygiene and grooming Neuro: alert and oriented. Normal strength, sensation, gait, and DTR's 2+ and symmetric.  GAD-7 score 0 PHQ-9 score of 0   ASSESSMENT/PLAN:  Annual physical exam - Plan: Hepatitis C antibody, HIV Antibody (routine testing w rflx), RPR, GC/Chlamydia Probe Amp, TSH, POCT Urinalysis DIP (Proadvantage Device)  Generalized anxiety disorder - Very well controlled on citalopram 51m, but also pretty stress-free currently.  Goes back to school soon.  Depression, major, in remission (HMosses - Plan: TSH  Encounter for surveillance of contraceptive pills - doing well on current OCP's, refilled  Acne vulgaris - improved/controlled - Plan: Norgestimate-Ethinyl Estradiol Triphasic (TRI-ESTARYLLA) 0.18/0.215/0.25 MG-35 MCG tablet  Anxiety and depression - Well controlled on citalopram 459m Resume therapy after school starts - Plan: citalopram (CELEXA) 40 MG tablet  Screen for STD (sexually transmitted disease) - condoms encouraged; discussed #sexual partners, risks - Plan: HIV Antibody (routine testing w rflx), RPR, GC/Chlamydia Probe Amp  Need for hepatitis C screening test - discussed new recommendation for screening all adults - Plan: Hepatitis C antibody  Abnormal TSH - low TSH on last check; recheck today - Plan: TSH   Urine dip Urine chlamydia  HIV, RPR, TSH (low on last check) Hep  C screen  Citalopram last RF #90 in 02/2020. OCP's due for year refill (just got 1 pack) Both refilled x 1 year. F/u 1 year, sooner prn.

## 2020-05-09 NOTE — Patient Instructions (Signed)
Well Child Nutrition, Young Adult This sheet provides general nutrition recommendations. Talk with a health care provider or a diet and nutrition specialist (dietitian) if you have any questions. Nutrition   The amount of food you need to eat every day depends on your age, sex, size, and activity level. To figure out your daily calorie needs, look for a calorie calculator online or talk with your health care provider. Balanced diet Eat a balanced diet. Try to include:  Fruits. Aim for 2 cups a day. Examples of 1 cup of fruit include 1 large banana, 1 small apple, 8 large strawberries, or 1 large orange. Eat a variety of whole fruits and 100% fruit juice. Choose fresh, canned, frozen, or dried forms. Choose canned fruit that has the lowest added sugar or no added sugar.  Vegetables. Aim for 2-3 cups a day. Examples of 1 cup of vegetables include 2 medium carrots, 1 large tomato, or 2 stalks of celery. Choose fresh, frozen, canned, and dried options. Eat vegetables of a variety of colors.  Low-fat dairy. Aim for 3 cups a day. Examples of 1 cup of dairy include 8 oz (230 mL) of milk, 8 oz (230 g) of yogurt, or 1 oz (44 g) of natural cheese. Choose fat-free or low-fat dairy products, including milk, yogurt, and cheese. If you are unable to tolerate dairy (lactose intolerant) or you choose not to consume dairy, you may include fortified soy beverages (soy milk).  Whole grains. Of the grain foods that you eat each day (such as pasta, rice, and tortillas), aim to include 6-8 "ounce-equivalents" of whole-grain options. Examples of 1 ounce-equivalent of whole grains include 1 cup of whole-wheat cereal,  cup of brown rice, or 1 slice of whole-wheat bread. Try to choose whole grains including brown rice, wild rice, quinoa, and oats.  Lean proteins. Aim for 5-6 "ounce-equivalents" a day. Eat a variety of protein foods, including lean meats, seafood, poultry, eggs, legumes (beans and peas), nuts,  seeds, and soy products. ? A cut of meat or fish that is the size of a deck of cards is about 3-4 ounce-equivalents. ? Foods that provide 1 ounce-equivalent of protein include 1 egg,  cup of nuts or seeds, or 1 tablespoon (16 g) of peanut butter. For more information and options for foods in a balanced diet, visit www.DisposableNylon.bechoosemyplate.gov Tips for healthy snacking  A snack should not be the size of a full meal. Eat snacks that have 200 calories or less. Examples include: ?  whole-wheat pita with  cup hummus. ? 2 or 3 slices of deli Malawiturkey wrapped around a cheese stick. ?  apple with 1 tablespoon of peanut butter. ? 10 baked chips with salsa.  Keep cut-up fruits and vegetables available at home and at school so they are easy to eat.  Pack healthy snacks the night before or when you pack your lunch.  Avoid pre-packaged foods. These tend to be higher in fat, sugar, and salt (sodium).  Get involved with shopping, or ask the primary food shopper in your household to get healthy snacks that you like.  Avoid chips, candy, cake, and soft drinks. Foods to avoid  Foy [place] or heavily processed foods, such as toaster pastries and microwaveable dinners.  Drinks that contain a lot of sugar, such as sports drinks, sodas, and juice.  Foods that contain a lot of fat, sodium, or sugar. Food safety Prepare your food safely:  Wash your hands after handling raw meats.  Keep food preparation surfaces  clean by washing them regularly with hot, soapy water.  Keep raw meats separate from foods that are ready-to-eat, such as fruits and vegetables.  Cook seafood, meat, poultry, and eggs to the recommended minimum safe internal temperature.  Store foods at safe temperatures. In general: ? Keep cold foods at 45F (4C) or colder. ? Keep your freezer at 14F (-18C or 18 degrees below 0C) or colder. ? Keep hot foods at 145F (60C) or warmer. ? Foods are no longer safe to eat when they have been at a  temperature of 40-145F (4-60C) for more than 2 hours. Physical activity  Try to get 150 minutes of moderate-intensity physical activity each week. Examples include walking briskly or bicycling slower than 10 miles an hour (16 km an hour).  Do muscle-strengthening exercises on 2 or more days a week.  If you find it difficult to fit regular physical activity into your schedule, try: ? Taking the stairs instead of the elevator. ? Parking your car farther from the entrance or at the back of the parking lot. ? Biking or walking to work or school.  If you need to lose weight, you may need to reduce your daily calorie intake and increase your daily amount of physical activity. Check with your health care provider before you start a new diet and exercise plan. General instructions  Do not skip meals, especially breakfast.  Water is the ideal beverage. Aim to drink six 8-oz glasses of water each day.  Avoid fad diets. These may affect your mood and growth.  If you choose to consume alcohol: ? Drink in moderation. This means two drinks a day for men and one drink a day for nonpregnant women. One drink equals 12 oz of beer, 5 oz of wine, or 1 oz of hard liquor.  You may drink coffee. It is recommended that you limit coffee intake to three to five 8-oz cups a day (up to 400 mg of caffeine).  If you are worried about your body image, talk with your parents, your health care provider, or another trusted adult like a coach or counselor. You may be at risk for developing an eating disorder. Eating disorders can lead to serious medical problems.  Food allergies may cause you to have a reaction (such as a rash, diarrhea, or vomiting) after eating or drinking. Talk with your health care provider if you have concerns about food allergies. Summary  Eat a balanced diet. Include fruits, vegetables, low-fat dairy, whole grains, and lean proteins.  Try to get 150 minutes of moderate-intensity physical  activity each week, and do muscle-strengthening exercises on 2 or more days a week.  Choose healthy snacks that are 200 calories or less.  Drink plenty of water. Try to drink six 8-oz glasses a day. This information is not intended to replace advice given to you by your health care provider. Make sure you discuss any questions you have with your health care provider. Document Revised: 01/19/2019 Document Reviewed: 05/14/2017 Elsevier Patient Education  2020 [employer].   Well Child Safety, Young Adult This sheet provides general safety recommendations. Talk with a health care provider if you have any questions. Home safety  Make sure your home or apartment has smoke detectors and carbon monoxide detectors. Test them once a month. Change their batteries every year.  If you keep guns and ammunition in the home, make sure they are stored separately and locked away.  Make your home a tobacco-free and drug-free environment. Motor vehicle safety  Wear a seat belt whenever you drive or ride in a vehicle.  Do not text, talk, or use your phone or other mobile devices while driving.  Do not drive when you are tired. If you feel like you may fall asleep while driving, pull over at a safe location and take a break or switch drivers.  Do not drive after drinking or using drugs. Plan for a designated driver or another way to go home.  Do not ride in a car with someone who has been using drugs or alcohol.  Do not ride in the bed or cargo area of a pickup truck. Sun safety    Use broad-spectrum sunscreen that protects against UVA and UVB radiation (SPF 15 or higher). ? Put on sunscreen 15-30 minutes before going outside. ? Reapply sunscreen every 2 hours, or more often if you get wet or if you are sweating. ? Use enough sunscreen to cover all exposed areas. Rub it in well.  Wear sunglasses when you are out in the sun.  Do not use tanning beds. Tanning beds are just as harmful  for your skin as the sun. Water safety  Never swim alone.  Only swim in designated areas.  Do not swim in areas where you do not know the water conditions or where underwater hazards are located. General instructions  Protect your hearing and avoid exposure to loud music or noises by: ? Wearing ear protection when you are in a noisy environment (while using loud machinery, like a lawn mower, or at concerts). ? Making sure that the volume is not too loud when listening to music in the car or through headphones.  Avoid tattoos and body piercings. Tattoos and body piercings can get infected. Personal safety  Do not use tobacco, drugs, anabolic steroids, or diet pills.  Do not drink or use drugs while swimming, boating, riding a bike or motorcycle, or using heavy machinery.  Do not drink heavily (binge drink). Your brain is still developing, and alcohol can affect your brain development.  Wear protective gear for sports and other physical activities, such as a helmet, mouth guard, eye protection, wrist guards, elbow pads, and knee pads. Wear a helmet when biking, riding a motorcycle or all-terrain vehicle (ATV), skateboarding, skiing, or snowboarding.  If you are sexually active, practice safe sex. Use a condom or other form of birth control (contraception) in order to prevent pregnancy and STIs (sexually transmitted infections).  Never leave a party or event alone without telling a friend that you are leaving. Never leave with a stranger.  Do not misuse medicines. This means that you should not take a medicine other than how it is prescribed and you should not take someone else's medicine.  Avoid risky situations or situations where you do not feel safe. Call for help if you find yourself in an unsafe situation.  Learn to manage conflict without using violence.  Never accept a drink from a stranger if you do not know where the drink came from.  Avoid people who suggest unsafe or  harmful behavior, and avoid unhealthy romantic relationships or friendships where you do not feel respected. No one has the right to pressure you into any activity that makes you feel uncomfortable. If others make you feel unsafe, you can: ? Ask for help from your parents or guardians, your health care provider, or other trusted adults like a Runner, broadcasting/film/video, coach, or counselor. ? Call the [employer] Violence Hotline at 386 057 0854 or go online: www.thehotline.org  Where to find more information:  American Academy of Pediatrics: www.healthychildren.org  Centers for Disease Control and Prevention: FootballExhibition.com.br Summary  Protect yourself from sun exposure by using broad-spectrum sunscreen that protects against UVA and UVB radiation (SPF 15 or higher).  Wear appropriate protective gear when playing sports and doing other activities. Gear may include a helmet, mouth guard, eye protection, wrist guards, and elbow and knee pads.  Be safe when driving or riding in vehicles. While driving: Wear a seat belt. Do not use your mobile device. Do not drink or use drugs.  Always be aware of your surroundings. Avoid risky situations or places where you feel unsafe.  Avoid relationships or friendships in which you do not feel respected. It is okay to ask for help from your parents or guardians, your health care provider, or other trusted adults like a Runner, broadcasting/film/video, coach, or counselor. This information is not intended to replace advice given to you by your health care provider. Make sure you discuss any questions you have with your health care provider. Document Revised: 03/22/2019 Document Reviewed: 05/12/2017 Elsevier Patient Education  2020 [employer].

## 2020-05-10 ENCOUNTER — Other Ambulatory Visit: Payer: Self-pay

## 2020-05-10 ENCOUNTER — Ambulatory Visit (INDEPENDENT_AMBULATORY_CARE_PROVIDER_SITE_OTHER): Payer: BC Managed Care – PPO | Admitting: Family Medicine

## 2020-05-10 ENCOUNTER — Encounter: Payer: Self-pay | Admitting: Family Medicine

## 2020-05-10 VITALS — BP 110/70 | HR 72 | Ht 68.0 in | Wt 164.8 lb

## 2020-05-10 DIAGNOSIS — Z3041 Encounter for surveillance of contraceptive pills: Secondary | ICD-10-CM | POA: Diagnosis not present

## 2020-05-10 DIAGNOSIS — Z113 Encounter for screening for infections with a predominantly sexual mode of transmission: Secondary | ICD-10-CM | POA: Diagnosis not present

## 2020-05-10 DIAGNOSIS — F411 Generalized anxiety disorder: Secondary | ICD-10-CM

## 2020-05-10 DIAGNOSIS — Z1159 Encounter for screening for other viral diseases: Secondary | ICD-10-CM | POA: Diagnosis not present

## 2020-05-10 DIAGNOSIS — Z0000 Encounter for general adult medical examination without abnormal findings: Secondary | ICD-10-CM | POA: Diagnosis not present

## 2020-05-10 DIAGNOSIS — F329 Major depressive disorder, single episode, unspecified: Secondary | ICD-10-CM

## 2020-05-10 DIAGNOSIS — L700 Acne vulgaris: Secondary | ICD-10-CM

## 2020-05-10 DIAGNOSIS — F325 Major depressive disorder, single episode, in full remission: Secondary | ICD-10-CM

## 2020-05-10 DIAGNOSIS — R7989 Other specified abnormal findings of blood chemistry: Secondary | ICD-10-CM

## 2020-05-10 DIAGNOSIS — F419 Anxiety disorder, unspecified: Secondary | ICD-10-CM

## 2020-05-10 DIAGNOSIS — F32A Depression, unspecified: Secondary | ICD-10-CM

## 2020-05-10 LAB — POCT URINALYSIS DIP (PROADVANTAGE DEVICE)
Bilirubin, UA: NEGATIVE
Blood, UA: NEGATIVE
Glucose, UA: NEGATIVE mg/dL
Ketones, POC UA: NEGATIVE mg/dL
Leukocytes, UA: NEGATIVE
Nitrite, UA: NEGATIVE
Protein Ur, POC: NEGATIVE mg/dL
Specific Gravity, Urine: 1.01
Urobilinogen, Ur: NEGATIVE
pH, UA: 6 (ref 5.0–8.0)

## 2020-05-10 MED ORDER — CITALOPRAM HYDROBROMIDE 40 MG PO TABS: 40.0000 mg | ORAL_TABLET | Freq: Every day | ORAL | 3 refills | Status: DC

## 2020-05-10 MED ORDER — NORGESTIM-ETH ESTRAD TRIPHASIC 0.18/0.215/0.25 MG-35 MCG PO TABS: 1.0000 | ORAL_TABLET | Freq: Every day | ORAL | 3 refills | Status: DC

## 2020-05-11 LAB — TSH: TSH: 0.481 u[IU]/mL (ref 0.450–4.500)

## 2020-05-11 LAB — RPR: RPR Ser Ql: NONREACTIVE

## 2020-05-11 LAB — HIV ANTIBODY (ROUTINE TESTING W REFLEX): HIV Screen 4th Generation wRfx: NONREACTIVE

## 2020-05-11 LAB — HEPATITIS C ANTIBODY: Hep C Virus Ab: 0.1 s/co ratio (ref 0.0–0.9)

## 2020-05-12 LAB — GC/CHLAMYDIA PROBE AMP
Chlamydia trachomatis, NAA: NEGATIVE
Neisseria Gonorrhoeae by PCR: NEGATIVE

## 2020-05-16 ENCOUNTER — Ambulatory Visit (INDEPENDENT_AMBULATORY_CARE_PROVIDER_SITE_OTHER): Payer: BC Managed Care – PPO

## 2020-05-16 ENCOUNTER — Other Ambulatory Visit: Payer: Self-pay

## 2020-05-16 DIAGNOSIS — J4550 Severe persistent asthma, uncomplicated: Secondary | ICD-10-CM

## 2020-05-28 ENCOUNTER — Other Ambulatory Visit: Payer: Self-pay | Admitting: Family Medicine

## 2020-05-28 DIAGNOSIS — L700 Acne vulgaris: Secondary | ICD-10-CM

## 2020-06-06 ENCOUNTER — Ambulatory Visit: Payer: Self-pay

## 2020-06-08 DIAGNOSIS — J00 Acute nasopharyngitis [common cold]: Secondary | ICD-10-CM | POA: Diagnosis not present

## 2020-06-16 ENCOUNTER — Telehealth: Payer: Self-pay

## 2020-06-16 ENCOUNTER — Ambulatory Visit (INDEPENDENT_AMBULATORY_CARE_PROVIDER_SITE_OTHER): Payer: BC Managed Care – PPO

## 2020-06-16 DIAGNOSIS — J4550 Severe persistent asthma, uncomplicated: Secondary | ICD-10-CM | POA: Diagnosis not present

## 2020-06-16 NOTE — Telephone Encounter (Signed)
L/M for mother to contact me to inquire if [facility] services can give injection or another allergist so we can arrange shipment

## 2020-06-16 NOTE — Telephone Encounter (Signed)
pt came in for her xolair and was wondering about taking her [place]ry [place] out to the [employer] as she will be starting there soon. Please advise as I did inform her I do not believe we send out the xolair generally to colleges.

## 2020-06-21 NOTE — Telephone Encounter (Signed)
L/m for mother to call me

## 2020-07-07 ENCOUNTER — Ambulatory Visit (INDEPENDENT_AMBULATORY_CARE_PROVIDER_SITE_OTHER): Payer: BC Managed Care – PPO

## 2020-07-07 DIAGNOSIS — J4550 Severe persistent asthma, uncomplicated: Secondary | ICD-10-CM | POA: Diagnosis not present

## 2020-07-11 NOTE — Telephone Encounter (Signed)
Mom never called back after I returned her call. She can reach out to me directly for instructions on how we can get Xolair to [facility]

## 2020-07-28 ENCOUNTER — Ambulatory Visit (INDEPENDENT_AMBULATORY_CARE_PROVIDER_SITE_OTHER): Payer: BC Managed Care – PPO

## 2020-07-28 ENCOUNTER — Other Ambulatory Visit: Payer: Self-pay

## 2020-07-28 DIAGNOSIS — J4550 Severe persistent asthma, uncomplicated: Secondary | ICD-10-CM

## 2020-08-13 DIAGNOSIS — R55 Syncope and collapse: Secondary | ICD-10-CM | POA: Diagnosis not present

## 2020-08-13 DIAGNOSIS — Z23 Encounter for immunization: Secondary | ICD-10-CM | POA: Diagnosis not present

## 2020-08-13 DIAGNOSIS — Z3200 Encounter for pregnancy test, result unknown: Secondary | ICD-10-CM | POA: Diagnosis not present

## 2020-08-13 DIAGNOSIS — R42 Dizziness and giddiness: Secondary | ICD-10-CM | POA: Diagnosis not present

## 2020-08-13 DIAGNOSIS — Z20828 Contact with and (suspected) exposure to other viral communicable diseases: Secondary | ICD-10-CM | POA: Diagnosis not present

## 2020-08-16 ENCOUNTER — Encounter: Payer: Self-pay | Admitting: *Deleted

## 2020-08-17 DIAGNOSIS — L040 Acute lymphadenitis of face, head and neck: Secondary | ICD-10-CM | POA: Diagnosis not present

## 2020-08-17 DIAGNOSIS — L049 Acute lymphadenitis, unspecified: Secondary | ICD-10-CM | POA: Diagnosis not present

## 2020-08-17 DIAGNOSIS — J029 Acute pharyngitis, unspecified: Secondary | ICD-10-CM | POA: Diagnosis not present

## 2020-08-19 DIAGNOSIS — J029 Acute pharyngitis, unspecified: Secondary | ICD-10-CM | POA: Diagnosis not present

## 2020-08-19 DIAGNOSIS — B2790 Infectious mononucleosis, unspecified without complication: Secondary | ICD-10-CM | POA: Diagnosis not present

## 2020-08-25 ENCOUNTER — Ambulatory Visit: Payer: Self-pay

## 2020-09-01 ENCOUNTER — Other Ambulatory Visit: Payer: Self-pay

## 2020-09-01 ENCOUNTER — Ambulatory Visit (INDEPENDENT_AMBULATORY_CARE_PROVIDER_SITE_OTHER): Payer: BC Managed Care – PPO

## 2020-09-01 DIAGNOSIS — J4550 Severe persistent asthma, uncomplicated: Secondary | ICD-10-CM | POA: Diagnosis not present

## 2020-09-22 ENCOUNTER — Ambulatory Visit: Payer: Self-pay

## 2020-10-02 ENCOUNTER — Other Ambulatory Visit: Payer: Self-pay

## 2020-10-02 ENCOUNTER — Ambulatory Visit (INDEPENDENT_AMBULATORY_CARE_PROVIDER_SITE_OTHER): Payer: BC Managed Care – PPO

## 2020-10-02 DIAGNOSIS — J4550 Severe persistent asthma, uncomplicated: Secondary | ICD-10-CM

## 2020-10-02 MED ORDER — OMALIZUMAB 150 MG ~~LOC~~ SOLR
375.0000 mg | SUBCUTANEOUS | Status: AC
  Administered 2020-10-02 – 2021-08-28 (×12): 375 mg via SUBCUTANEOUS

## 2020-10-20 ENCOUNTER — Ambulatory Visit: Payer: Self-pay

## 2020-10-24 ENCOUNTER — Other Ambulatory Visit: Payer: Self-pay

## 2020-10-24 ENCOUNTER — Ambulatory Visit (INDEPENDENT_AMBULATORY_CARE_PROVIDER_SITE_OTHER): Payer: BC Managed Care – PPO

## 2020-10-24 DIAGNOSIS — J4550 Severe persistent asthma, uncomplicated: Secondary | ICD-10-CM | POA: Diagnosis not present

## 2020-11-14 ENCOUNTER — Ambulatory Visit (INDEPENDENT_AMBULATORY_CARE_PROVIDER_SITE_OTHER): Payer: BC Managed Care – PPO

## 2020-11-14 ENCOUNTER — Other Ambulatory Visit: Payer: Self-pay

## 2020-11-14 DIAGNOSIS — J4550 Severe persistent asthma, uncomplicated: Secondary | ICD-10-CM | POA: Diagnosis not present

## 2020-12-05 ENCOUNTER — Ambulatory Visit: Payer: Self-pay

## 2020-12-09 ENCOUNTER — Other Ambulatory Visit: Payer: Self-pay | Admitting: Allergy and Immunology

## 2020-12-12 ENCOUNTER — Ambulatory Visit (INDEPENDENT_AMBULATORY_CARE_PROVIDER_SITE_OTHER): Payer: BC Managed Care – PPO | Admitting: *Deleted

## 2020-12-12 ENCOUNTER — Other Ambulatory Visit: Payer: Self-pay

## 2020-12-12 DIAGNOSIS — J4550 Severe persistent asthma, uncomplicated: Secondary | ICD-10-CM | POA: Diagnosis not present

## 2020-12-12 DIAGNOSIS — J4540 Moderate persistent asthma, uncomplicated: Secondary | ICD-10-CM

## 2021-01-08 ENCOUNTER — Ambulatory Visit: Payer: Self-pay

## 2021-01-30 ENCOUNTER — Ambulatory Visit (INDEPENDENT_AMBULATORY_CARE_PROVIDER_SITE_OTHER): Payer: BC Managed Care – PPO | Admitting: *Deleted

## 2021-01-30 ENCOUNTER — Other Ambulatory Visit: Payer: Self-pay

## 2021-01-30 DIAGNOSIS — J4550 Severe persistent asthma, uncomplicated: Secondary | ICD-10-CM | POA: Diagnosis not present

## 2021-02-20 ENCOUNTER — Ambulatory Visit: Payer: Self-pay

## 2021-02-23 ENCOUNTER — Telehealth: Payer: Self-pay | Admitting: *Deleted

## 2021-02-23 NOTE — Telephone Encounter (Signed)
LM for patient to contact office for MD appt for Xolair reapproval 

## 2021-02-26 ENCOUNTER — Ambulatory Visit (INDEPENDENT_AMBULATORY_CARE_PROVIDER_SITE_OTHER): Payer: BC Managed Care – PPO | Admitting: *Deleted

## 2021-02-26 ENCOUNTER — Other Ambulatory Visit: Payer: Self-pay

## 2021-02-26 DIAGNOSIS — J4550 Severe persistent asthma, uncomplicated: Secondary | ICD-10-CM

## 2021-03-13 NOTE — Patient Instructions (Addendum)
Severe persistent asthma   Continue Xolair injections every 3 weeks.    Have access to albuterol inhaler 2 puffs every 4-6 hours as needed for cough/wheeze/shortness of breath/chest tightness.  May use 15-20 minutes prior to activity.   Monitor frequency of use.    Food Allergy    Continue avoidance of milk, dairy, shellfish, fish, peanut/tree nuts, coconut, sesame seed, apple, and mango    In case of an allergic reaction, give Benadryl 4 teaspoonfuls every 6 hours, and life-threatening  symptoms occur, inject with AuviQ 0.3 mg    follow emergency action plan in case of allergic reaction  Allergic rhinitis  Continue avoidance measures for dust mites, cat, dog, grass pollens, tree pollens,weed        pollens and  cockroach  Continue with Claritin 10mg  daily.    Continue Flonase 1 spray each nostril twice a day.    Continue Optivar eye drops 1 drop each eye twice a day as needed  Schedule a follow up appointment in 6 months or sooner if needed

## 2021-03-14 ENCOUNTER — Encounter: Payer: Self-pay | Admitting: Family

## 2021-03-14 ENCOUNTER — Other Ambulatory Visit: Payer: Self-pay

## 2021-03-14 ENCOUNTER — Ambulatory Visit (INDEPENDENT_AMBULATORY_CARE_PROVIDER_SITE_OTHER): Payer: BC Managed Care – PPO | Admitting: Family

## 2021-03-14 VITALS — BP 116/72 | HR 70 | Temp 98.2°F | Resp 16 | Ht 68.0 in | Wt 148.0 lb

## 2021-03-14 DIAGNOSIS — T7800XD Anaphylactic reaction due to unspecified food, subsequent encounter: Secondary | ICD-10-CM

## 2021-03-14 DIAGNOSIS — J301 Allergic rhinitis due to pollen: Secondary | ICD-10-CM | POA: Diagnosis not present

## 2021-03-14 DIAGNOSIS — J4550 Severe persistent asthma, uncomplicated: Secondary | ICD-10-CM

## 2021-03-14 MED ORDER — EPINEPHRINE 0.3 MG/0.3ML IJ SOAJ: 0.3000 mg | INTRAMUSCULAR | 1 refills | Status: DC | PRN

## 2021-03-14 MED ORDER — ALBUTEROL SULFATE HFA 108 (90 BASE) MCG/ACT IN AERS: INHALATION_SPRAY | RESPIRATORY_TRACT | 1 refills | Status: DC

## 2021-03-14 NOTE — Progress Notes (Signed)
[address] Debbora Presto [place] [address] Dept: [phone]  FOLLOW UP NOTE  Patient ID: Samantha Cole, female    DOB: 2001-09-09  Age: 20 y.o. MRN: [id number] Date of Office Visit: 03/14/2021  Assessment  Chief Complaint: Asthma (ACT -25 No flares or symptoms ) and Allergic Rhinitis  (No symptoms )  HPI Samantha Cole is a 20 year old female who presents today for follow-up of severe persistent asthma, anaphylaxis due to food, and allergic rhinitis with conjunctivitis.  She was last seen on March 16, 2020 by Dr. Delorse Lek.  Severe persistent asthma is reported as well controlled with Xolair every 3 weeks and albuterol as needed.  She denies any coughing, wheezing, tightness in chest, shortness of breath, and nocturnal awakenings due to breathing problems.  Since her last office visit she has not required any systemic steroids or made any trips to the emergency room or urgent care due to breathing problems.  She reports rare use of her albuterol inhaler.  She continues to avoid milk, dairy, shellfish, fish, peanut, tree nuts, coconut, sesame seed, apple, and mango without any accidental ingestion or use of her Auvi-Q device.  Allergic rhinitis with conjunctivitis is reported as controlled with Flonase nasal spray as needed and either Claritin or Allegra once a day as needed.  She denies any rhinorrhea, nasal congestion and postnasal drip.  She is currently not needing to use Dymista nasal spray.   Drug Allergies:  Allergies  Allergen Reactions  . Other Anaphylaxis    Tree nuts, Coconut, Sesame  . Shellfish Allergy Anaphylaxis  . Lactose Intolerance (Gi)     Review of Systems: Review of Systems  Constitutional: Negative for chills and fever.  HENT:       Denies rhinorrhea, nasal congestion, and postnasal drip  Eyes:       Denies itchy watery eyes  Respiratory: Negative for cough, shortness of breath and wheezing.   Cardiovascular: Negative for chest pain and palpitations.   Gastrointestinal:       Denies heartburn or reflux  Genitourinary: Negative for dysuria.  Skin: Negative for itching and rash.  Neurological: Negative for headaches.  Endo/Heme/Allergies: Positive for environmental allergies.    Physical Exam: BP 116/72   Pulse 70   Temp 98.2 F (36.8 C)   Resp 16   Ht 5\' 8"  (1.727 m)   Wt 148 lb (67.1 kg)   SpO2 97%   BMI 22.50 kg/m    Physical Exam Constitutional:      Appearance: Normal appearance.  HENT:     Head: Normocephalic and atraumatic.     Comments: Pharynx normal, eyes normal, ears normal, nose normal    Right Ear: Tympanic membrane, ear canal and external ear normal.     Left Ear: Tympanic membrane, ear canal and external ear normal.     Nose: Nose normal.  Eyes:     Conjunctiva/sclera: Conjunctivae normal.  Cardiovascular:     Rate and Rhythm: Regular rhythm.     Heart sounds: Normal heart sounds.  Pulmonary:     Effort: Pulmonary effort is normal.     Breath sounds: Normal breath sounds.     Comments: Lungs clear to auscultation Musculoskeletal:     Cervical back: Neck supple.  Skin:    General: Skin is warm.  Neurological:     Mental Status: She is alert and oriented to person, place, and time.  Psychiatric:        Mood and Affect: Mood normal.  Behavior: Behavior normal.        Thought Content: Thought content normal.        Judgment: Judgment normal.     Diagnostics: FVC 4.53 L, FEV1 3.36 L.  Predicted FVC 4.30 L, predicted FEV1 3.72 L.  Spirometry indicates normal ventilatory function.  Assessment and Plan: 1. Severe persistent asthma without complication   2. Anaphylactic shock due to food, subsequent encounter   3. Non-seasonal allergic rhinitis due to pollen     No orders of the defined types were placed in this encounter.   Patient Instructions  Severe persistent asthma   Continue Xolair injections every 3 weeks.    Have access to albuterol inhaler 2 puffs every 4-6 hours as needed  for cough/wheeze/shortness of breath/chest tightness.  May use 15-20 minutes prior to activity.   Monitor frequency of use.    Food Allergy    Continue avoidance of milk, dairy, shellfish, fish, peanut/tree nuts, coconut, sesame seed, apple, and mango    In case of an allergic reaction, give Benadryl 4 teaspoonfuls every 6 hours, and life-threatening  symptoms occur, inject with AuviQ 0.3 mg    follow emergency action plan in case of allergic reaction  Allergic rhinitis  Continue avoidance measures for dust mites, cat, dog, grass pollens, tree pollens,weed        pollens and  cockroach  Continue with Claritin 10mg  daily.    Continue Flonase 1 spray each nostril twice a day.    Continue Optivar eye drops 1 drop each eye twice a day as needed  Schedule a follow up appointment in 6 months or sooner if needed    Return in about 6 months (around 09/13/2021), or if symptoms worsen or fail to improve.    Thank you for the opportunity to care for this patient.  Please do not hesitate to contact me with questions.  14/10/2020, FNP [facility]

## 2021-03-19 ENCOUNTER — Other Ambulatory Visit: Payer: Self-pay

## 2021-03-19 ENCOUNTER — Ambulatory Visit (INDEPENDENT_AMBULATORY_CARE_PROVIDER_SITE_OTHER): Payer: BC Managed Care – PPO

## 2021-03-19 DIAGNOSIS — J4550 Severe persistent asthma, uncomplicated: Secondary | ICD-10-CM

## 2021-04-09 ENCOUNTER — Other Ambulatory Visit: Payer: Self-pay

## 2021-04-09 ENCOUNTER — Ambulatory Visit (INDEPENDENT_AMBULATORY_CARE_PROVIDER_SITE_OTHER): Payer: BC Managed Care – PPO

## 2021-04-09 DIAGNOSIS — J4550 Severe persistent asthma, uncomplicated: Secondary | ICD-10-CM

## 2021-04-26 ENCOUNTER — Telehealth: Payer: Self-pay

## 2021-04-26 ENCOUNTER — Other Ambulatory Visit: Payer: Self-pay | Admitting: Family Medicine

## 2021-04-26 ENCOUNTER — Other Ambulatory Visit: Payer: Self-pay | Admitting: *Deleted

## 2021-04-26 DIAGNOSIS — L700 Acne vulgaris: Secondary | ICD-10-CM

## 2021-04-26 MED ORDER — NORGESTIM-ETH ESTRAD TRIPHASIC 0.18/0.215/0.25 MG-35 MCG PO TABS: 1.0000 | ORAL_TABLET | Freq: Every day | ORAL | 0 refills | Status: DC

## 2021-04-26 NOTE — Telephone Encounter (Signed)
Done

## 2021-04-26 NOTE — Telephone Encounter (Signed)
Called stating she needs a refill on her birth control medication to the [facility] on [address] In [place]. Has apt on 05/14/21 will need refilled before then almost out of medication now.

## 2021-04-26 NOTE — Telephone Encounter (Signed)
Is this okay?

## 2021-04-26 NOTE — Telephone Encounter (Signed)
Yes, ok for a pack

## 2021-04-30 ENCOUNTER — Other Ambulatory Visit: Payer: Self-pay

## 2021-04-30 ENCOUNTER — Ambulatory Visit (INDEPENDENT_AMBULATORY_CARE_PROVIDER_SITE_OTHER): Payer: BC Managed Care – PPO | Admitting: *Deleted

## 2021-04-30 DIAGNOSIS — J4550 Severe persistent asthma, uncomplicated: Secondary | ICD-10-CM

## 2021-05-13 NOTE — Progress Notes (Signed)
Chief Complaint  Patient presents with   Annual Exam    Nonfasting annual exam, sees eye doctor annually. No concerns.     Samantha Cole is a 20 y.o. female who presents for a complete physical.   She is doing well and has no concerns.  Anxiety and Depression: She has been on citalopram since 05/2018, and on the 55m dose since 11/2019. She had seen therapist at school that year, not this past year, has been doing well, didn't need.  PHQ-9 and GAD-7 scores were 0 at her CPE in 04/2020. She is tolerating medications without side effects. "I'm a lot better."  She reports some trouble focusing and staying asleep, denies anxiety or depression. Denies problems falling asleep, but wakes up 5am, then every hour until 8am.  Goes to bed at 11 (1 at the latest). Exercises regularly, no alcohol during the week (and sleep is the same on weeknights). Has some caffeine, only in the mornings.  Contraception:  Patient is on OCP's for contraception (along with condoms), cramps and acne. Cycles are normal, no significant cramps.  Periods are light and shorter. She is tolerating OCPs with any side effects--no nausea, migraines, no abnormal bleeding. Acne remains well controlled. Sexually active. 8 lifetime partner, uses condoms.  New partner since last year. Had full STD check last year.  She has asthma and allergies, [facility]. She is on Xolair q3 weeks and doing well.    Can't recall the last time she needed albuterol, none in the last year that she can recall. Uses claritin as needed for allergies,  Azelastine-fluticasone spray--uses seasonally.  She has tree-nut and shellfish allergy.  She has an Auvi-Q pen.  Care Everywhere reviewed: +Monospot test 08/2020 (had been negative the week prior) B12 level was low in 07/2020, at 267. She recalls taking B12 for a bit, none recently. She had normal CBC except plt 123  Immunization History  Administered Date(s) Administered    DTaP 10/30/2000, 05/19/2001, 12/10/2002, 06/19/2004, 01/23/2006   HPV 9-valent 07/22/2018, 04/21/2019   HPV Quadrivalent 01/14/2019   Hepatitis A 12/10/2002, 06/19/2004   Hepatitis B 1May 23, 2001 08/28/2000, 03/06/2001   HiB (PRP-OMP) 10/30/2000, 01/01/2001, 03/06/2001, 12/10/2002   IPV 01/01/2001, 03/06/2001, 04/29/2001, 01/23/2006   Influenza Split 08/10/2012   Influenza,inj,Quad PF,6+ Mos 09/15/2013, 08/17/2014, 09/04/2015, 08/12/2016, 08/15/2017, 07/22/2018, 07/20/2019, 08/13/2020   MMR 12/10/2002, 01/23/2006   Meningococcal B, OMV 11/10/2017, 04/21/2019   Meningococcal Conjugate 03/29/2015   Meningococcal Mcv4o 11/10/2017   Moderna Sars-Covid-2 Vaccination 01/07/2020, 02/07/2020, 01/04/2021   Pneumococcal-Unspecified 10/30/2000, 06/19/2004   Tdap 06/09/2012   Vitamin D-OH level of 38.3 in 10/2019 Lipid screen: Lab Results  Component Value Date   CHOL 146 03/29/2015   HDL 72 03/29/2015   LDLCALC 53 03/29/2015   TRIG 105 03/29/2015   CHOLHDL 2.0 03/29/2015   Last Pap smear: never Dentist: twice a year Ophtho: yearly Exercise: gym 5x/week--runs a mile on treadmill, weights. Rides bikes on campus.   PMH, PSH, SH and FH reviewed and updated  Outpatient Encounter Medications as of 05/14/2021  Medication Sig Note   Azelastine-Fluticasone 137-50 MCG/ACT SUSP Place 1 spray into the nose in the morning and at bedtime.    citalopram (CELEXA) 40 MG tablet Take 1 tablet (40 mg total) by mouth daily.    loratadine (CLARITIN) 10 MG tablet Take 10 mg by mouth daily.    Norgestimate-Ethinyl Estradiol Triphasic (TRI-ESTARYLLA) 0.18/0.215/0.25 MG-35 MCG tablet Take 1 tablet by mouth daily.    XArvid Right  150 MG injection INJECT 375 MG UNDER THE SKIN EVERY 14 DAYS (DISCARD UNUSED PORTION)    albuterol (VENTOLIN HFA) 108 (90 Base) MCG/ACT inhaler Inhale 2 puffs every 4-6 hours as needed for coughing, wheezing, tightness in chest, or shortness of breath. (Patient not taking: Reported on 05/14/2021)     EPINEPHrine (AUVI-Q) 0.3 mg/0.3 mL IJ SOAJ injection Inject 0.3 mg into the muscle as needed for anaphylaxis. (Patient not taking: Reported on 05/14/2021)    Multiple Vitamin (MULTIVITAMIN) tablet Take 1 tablet by mouth daily. (Patient not taking: Reported on 05/14/2021) 05/14/2021: Ran out 3 weeks ago   Facility-Administered Encounter Medications as of 05/14/2021  Medication   omalizumab Arvid Right) injection 375 mg   omalizumab Arvid Right) injection 375 mg   Allergies  Allergen Reactions   Other Anaphylaxis    Tree nuts, Coconut, Sesame   Shellfish Allergy Anaphylaxis   Lactose Intolerance (Gi)     ROS: No fever, chills, headaches, dizziness, fainting spells, URI symptoms ,cough, shortness of breath, chest pain, palpitations, nausea, vomiting, bowel changes, urinary complaints. Periods are regular. No bleeding, bruising, rash, joint pains or other concerns. Acne is well controlled. Moods are controlled. Some terminal insomnia per HPI. Asthma and allergies are controlled. +intentional weight loss in the last year (eating out less, continuing to exercise)   PHYSICAL EXAM:  BP 108/70   Pulse 68   Ht '5\' 8"'  (1.727 m)   Wt 146 lb 9.6 oz (66.5 kg)   LMP 04/26/2021 (Exact Date)   BMI 22.29 kg/m   Wt Readings from Last 3 Encounters:  05/14/21 146 lb 9.6 oz (66.5 kg)  03/14/21 148 lb (67.1 kg)  05/10/20 164 lb 12.8 oz (74.8 kg) (90 %, Z= 1.26)*   * Growth percentiles are based on CDC (Girls, 2-20 Years) data.   Well developed, pleasant, cooperative female in no distress HEENT: PERRL, EOMI, conjunctiva clear.  TM's and EAC's normal. Nose and mouth not examined, wearing mask due to COVID-19 pandemic. Neck: no lymphadenopathy, thyromegaly or mass Heart: regular rate and rhythm without murmur Lungs: clear bilaterally.  Good air movement. No wheezes, rales, ronchi Abdomen: soft, nontender, no organomegaly or mass Back: no spinal tenderness, scoliosis or CVA tenderness Breast exam: no nipple  discharge, inversion, no skin dimpling, breast masses or axillary lymphadenopathy Pelvic deferred Extremities: no clubbing, cyanosis or edema.  2+ pulses Skin: no rashes, suspicious lesions or bruising. Tattoo on back of neck. Psych: normal mood, affect, hygiene and grooming Neuro: alert and oriented. Normal strength, sensation, gait, and DTR's 2+ and symmetric.  GAD-7 score  2 PHQ-9 score of  6   ASSESSMENT/PLAN:   Annual physical exam - Plan: POCT Urinalysis DIP (Proadvantage Device), GC/Chlamydia Probe Amp  Acne vulgaris - improved/controlled - Plan: Norgestimate-Ethinyl Estradiol Triphasic (TRI-ESTARYLLA) 0.18/0.215/0.25 MG-35 MCG tablet  Anxiety and depression - Well controlled on citalopram 27m, continue - Plan: citalopram (CELEXA) 40 MG tablet  Screen for STD (sexually transmitted disease) - cont regular condom use in addition to OCPs - Plan: GC/Chlamydia Probe Amp, Vitamin B12, CBC with Differential/Platelet, RPR, HIV Antibody (routine testing w rflx)  Low vitamin B12 level - unclear etiology.  Will recheck - Plan: Vitamin B12, CBC with Differential/Platelet  Terminal insomnia - counseled re: sleep hygiene and some techniques to help   Pap next year Tetanus next year  B12 was lower end of normal. No longer taking MVI or B12 supplement, will recheck level.   Discussed monthly self breast exams, at least 30 minutes of aerobic activity  at least 5 days/week, weight-bearing exercise at least 2x/week; proper sunscreen use reviewed; healthy diet, including goals of calcium and vitamin D intake; regular seatbelt use; safe sex, avoidance of excessive alcohol, recreational drugs.  Immunization recommendations discussed, tetanus booster next year.   F/u 1 year, sooner prn.

## 2021-05-13 NOTE — Patient Instructions (Addendum)
Please get more multivitamin and take it daily (vs getting a separate Vitamin D 1000 IU)  Young Adult safety This sheet provides general safety recommendations. Talk with a health careprovider if you have any questions. Home safety Make sure your home or apartment has smoke detectors and carbon monoxide detectors. Test them once a month. Change their batteries every year. If you keep guns and ammunition in the home, make sure they are stored separately and locked away. Make your home a tobacco-free and drug-free environment. Motor vehicle safety  Wear a seat belt whenever you drive or ride in a vehicle. Do not text, talk, or use your phone or other mobile devices while driving. Do not drive when you are tired. If you feel like you may fall asleep while driving, pull over at a safe location and take a break or switch drivers. Do not drive after drinking or using drugs. Plan for a designated driver or another way to go home. Do not ride in a car with someone who has been using drugs or alcohol. Do not ride in the bed or cargo area of a pickup truck. Sun safety  Use broad-spectrum sunscreen that protects against UVA and UVB radiation (SPF 15 or higher). Put on sunscreen 15-30 minutes before going outside. Reapply sunscreen every 2 hours, or more often if you get wet or if you are sweating. Use enough sunscreen to cover all exposed areas. Rub it in well. Wear sunglasses when you are out in the sun. Do not use tanning beds. Tanning beds are just as harmful for your skin as the sun. Water safety Never swim alone. Only swim in designated areas. Do not swim in areas where you do not know the water conditions or where underwater hazards are located. Personal safety Do not use tobacco, drugs, anabolic steroids, or diet pills. Do not drink or use drugs while swimming, boating, riding a bike or motorcycle, or using heavy machinery. Do not drink heavily (binge drink). Your brain is still  developing, and alcohol can affect your brain development. Wear protective gear for sports and other physical activities, such as a helmet, mouth guard, eye protection, wrist guards, elbow pads, and knee pads. Wear a helmet when biking, riding a motorcycle or all-terrain vehicle (ATV), skateboarding, skiing, or snowboarding. If you are sexually active, practice safe sex. Use a condom or other form of birth control (contraception) in order to prevent pregnancy and STIs (sexually transmitted infections). If you do not wish to become pregnant, use a form of birth control. If you plan to become pregnant, see your health care provider for a preconception visit. Avoid risky situations or situations where you do not feel safe. Call for help if you find yourself in an unsafe situation. Neverleave a party or event alone without telling a friend that you are leaving. Never leave with a stranger. Neveraccept a drink from a stranger if you do not know where the drink came from. Do not misuse medicines. This means that you should not take a medicine other than how it is prescribed and you should not take someone else's medicine. Learn to manage conflict without using violence. Avoid people who suggest unsafe or harmful behavior, and avoid unhealthy romantic relationships or friendships where you do not feel respected. No one has the right to pressure you into any activity that makes you feel uncomfortable. If others make you feel unsafe, you can: Ask for help from your parents or guardians, your health care provider, or other trusted  adults like a Runner, broadcasting/film/video, Psychologist, occupational, or counselor. Call the [employer] Violence Hotline at 330-712-5087 or go online: www.thehotline.org General instructions Protect your hearing and avoid exposure to loud music or noises by: Wearing ear protection when you are in a noisy environment (while using loud machinery, like a lawn mower, or at concerts). Making sure that the volume is not  too loud when listening to music in the car or through headphones. Avoid tattoos and body piercings. Tattoos and body piercings can get infected. Where to find more information: American Academy of Pediatrics: www.healthychildren.org Centers for Disease Control and Prevention: FootballExhibition.com.br Summary Protect yourself from sun exposure by using broad-spectrum sunscreen that protects against UVA and UVB radiation (SPF 15 or higher). Wear appropriate protective gear when playing sports and doing other activities. Gear may include a helmet, mouth guard, eye protection, wrist guards, and elbow and knee pads. Be safe when driving or riding in vehicles. While driving: Wear a seat belt. Do not use your mobile device. Do not drink or use drugs. Always be aware of your surroundings. Avoid risky situations or places where you feel unsafe. Avoid relationships or friendships in which you do not feel respected. It is okay to ask for help from your parents or guardians, your health care provider, or other trusted adults like a Runner, broadcasting/film/video, coach, or counselor. This information is not intended to replace advice given to you by your health care provider. Make sure you discuss any questions you have with your healthcare provider.  Well Child Nutrition, Young Adult This sheet provides general nutrition recommendations. Talk with a health care provider or a diet and nutrition specialist (dietitian) if you have anyquestions. Nutrition The amount of food you need to eat every day depends on your age, sex, size, and activity level. To figure out your daily calorie needs, look for a caloriecalculator online or talk with your health care provider. Balanced diet Eat a balanced diet. Try to include: Fruits. Aim for 2 cups a day. Examples of 1 cup of fruit include 1 large banana, 1 small apple, 8 large strawberries, or 1 large orange. Eat a variety of whole fruits and 100% fruit juice. Choose fresh, canned, frozen, or dried forms.  Choose canned fruit that has the lowest added sugar or no added sugar. Vegetables. Aim for 2-3 cups a day. Examples of 1 cup of vegetables include 2 medium carrots, 1 large tomato, or 2 stalks of celery. Choose fresh, frozen, canned, and dried options. Eat vegetables of a variety of colors. Low-fat dairy. Aim for 3 cups a day. Examples of 1 cup of dairy include 8 oz (230 mL) of milk, 8 oz (230 g) of yogurt, or 1 oz (44 g) of natural cheese. Choose fat-free or low-fat dairy products, including milk, yogurt, and cheese. If you are unable to tolerate dairy (lactose intolerant) or you choose not to consume dairy, you may include fortified soy beverages (soy milk). Whole grains. Of the grain foods that you eat each day (such as pasta, rice, and tortillas), aim to include 6-8 "ounce-equivalents" of whole-grain options. Examples of 1 ounce-equivalent of whole grains include 1 cup of whole-wheat cereal,  cup of brown rice, or 1 slice of whole-wheat bread. Try to choose whole grains including brown rice, wild rice, quinoa, and oats. Lean proteins. Aim for 5-6 "ounce-equivalents" a day. Eat a variety of protein foods, including lean meats, seafood, poultry, eggs, legumes (beans and peas), nuts, seeds, and soy products. A cut of meat or fish that is  the size of a deck of cards is about 3-4 ounce-equivalents. Foods that provide 1 ounce-equivalent of protein include 1 egg,  cup of nuts or seeds, or 1 tablespoon (16 g) of peanut butter. For more information and options for foods in a balanced diet, visit www.DisposableNylon.bechoosemyplate.gov Tips for healthy snacking A snack should not be the size of a full meal. Eat snacks that have 200 calories or less. Examples include:  whole-wheat pita with  cup hummus. 2 or 3 slices of deli [place]urkey wrapped around a cheese stick.  apple with 1 tablespoon of peanut butter. 10 baked chips with salsa. Keep cut-up fruits and vegetables available at home and at school so they are easy to  eat. Pack healthy snacks the night before or when you pack your lunch. Avoid pre-packaged foods. These tend to be higher in fat, sugar, and salt (sodium). Get involved with shopping, or ask the primary food shopper in your household to get healthy snacks that you like. Avoid chips, candy, cake, and soft drinks. Foods to avoid Foy [place] or heavily processed foods, such as toaster pastries and microwaveable dinners. Drinks that contain a lot of sugar, such as sports drinks, sodas, and juice. Foods that contain a lot of fat, sodium, or sugar. Food safety Prepare your food safely: Wash your hands after handling raw meats. Keep food preparation surfaces clean by washing them regularly with hot, soapy water. Keep raw meats separate from foods that are ready-to-eat, such as fruits and vegetables. Cook seafood, meat, poultry, and eggs to the recommended minimum safe internal temperature. Store foods at safe temperatures. In general: Keep cold foods at 64F (4C) or colder. Keep your freezer at 47F (-18C or 18 degrees below 0C) or colder. Keep hot foods at 164F (60C) or warmer. Foods are no longer safe to eat when they have been at a temperature of 40-164F (4-60C) for more than 2 hours. Physical activity Try to get 150 minutes of moderate-intensity physical activity each week. Examples include walking briskly or bicycling slower than 10 miles an hour (16 km an hour). Do muscle-strengthening exercises on 2 or more days a week. If you find it difficult to fit regular physical activity into your schedule, try: Taking the stairs instead of the elevator. Parking your car farther from the entrance or at the back of the parking lot. Biking or walking to work or school. If you need to lose weight, you may need to reduce your daily calorie intake and increase your daily amount of physical activity. Check with your health care provider before you start a new diet and exercise plan. General  instructions Do not skip meals, especially breakfast. Water is the ideal beverage. Aim to drink six 8-oz glasses of water each day. Avoid fad diets. These may affect your mood and growth. If you choose to consume alcohol: Drink in moderation. This means two drinks a day for men and one drink a day for nonpregnant women. One drink equals 12 oz of beer, 5 oz of wine, or 1 oz of hard liquor. You may drink coffee. It is recommended that you limit coffee intake to three to five 8-oz cups a day (up to 400 mg of caffeine). If you are worried about your body image, talk with your parents, your health care provider, or another trusted adult like a coach or counselor. You may be at risk for developing an eating disorder. Eating disorders can lead to serious medical problems. Food allergies may cause you to have a reaction (  such as a rash, diarrhea, or vomiting) after eating or drinking. Talk with your health care provider if you have concerns about food allergies. Summary Eat a balanced diet. Include fruits, vegetables, low-fat dairy, whole grains, and lean proteins. Try to get 150 minutes of moderate-intensity physical activity each week, and do muscle-strengthening exercises on 2 or more days a week. Choose healthy snacks that are 200 calories or less. Drink plenty of water. Try to drink six 8-oz glasses a day. This information is not intended to replace advice given to you by your health care provider. Make sure you discuss any questions you have with your healthcare provider. Document Revised: 09/20/2020 Document Reviewed: 09/20/2020 Elsevier Patient Education  2022 Elsevier Inc.  Document Revised: 09/15/2020 Document Reviewed: 09/15/2020 Elsevier Patient Education  2022 [employer].

## 2021-05-14 ENCOUNTER — Other Ambulatory Visit: Payer: Self-pay

## 2021-05-14 ENCOUNTER — Ambulatory Visit (INDEPENDENT_AMBULATORY_CARE_PROVIDER_SITE_OTHER): Payer: BC Managed Care – PPO | Admitting: Family Medicine

## 2021-05-14 ENCOUNTER — Encounter: Payer: Self-pay | Admitting: Family Medicine

## 2021-05-14 VITALS — BP 108/70 | HR 68 | Ht 68.0 in | Wt 146.6 lb

## 2021-05-14 DIAGNOSIS — Z113 Encounter for screening for infections with a predominantly sexual mode of transmission: Secondary | ICD-10-CM

## 2021-05-14 DIAGNOSIS — L700 Acne vulgaris: Secondary | ICD-10-CM

## 2021-05-14 DIAGNOSIS — F32A Depression, unspecified: Secondary | ICD-10-CM

## 2021-05-14 DIAGNOSIS — E538 Deficiency of other specified B group vitamins: Secondary | ICD-10-CM

## 2021-05-14 DIAGNOSIS — Z0000 Encounter for general adult medical examination without abnormal findings: Secondary | ICD-10-CM | POA: Diagnosis not present

## 2021-05-14 DIAGNOSIS — F419 Anxiety disorder, unspecified: Secondary | ICD-10-CM

## 2021-05-14 DIAGNOSIS — F5109 Other insomnia not due to a substance or known physiological condition: Secondary | ICD-10-CM

## 2021-05-14 LAB — POCT URINALYSIS DIP (PROADVANTAGE DEVICE)
Bilirubin, UA: NEGATIVE
Blood, UA: NEGATIVE
Glucose, UA: NEGATIVE mg/dL
Ketones, POC UA: NEGATIVE mg/dL
Leukocytes, UA: NEGATIVE
Nitrite, UA: NEGATIVE
Protein Ur, POC: NEGATIVE mg/dL
Specific Gravity, Urine: 1.015
Urobilinogen, Ur: NEGATIVE
pH, UA: 6 (ref 5.0–8.0)

## 2021-05-14 MED ORDER — CITALOPRAM HYDROBROMIDE 40 MG PO TABS: 40.0000 mg | ORAL_TABLET | Freq: Every day | ORAL | 3 refills | Status: DC

## 2021-05-14 MED ORDER — NORGESTIM-ETH ESTRAD TRIPHASIC 0.18/0.215/0.25 MG-35 MCG PO TABS: 1.0000 | ORAL_TABLET | Freq: Every day | ORAL | 3 refills | Status: DC

## 2021-05-15 LAB — CBC WITH DIFFERENTIAL/PLATELET
Basophils Absolute: 0.1 10*3/uL (ref 0.0–0.2)
Basos: 1 %
EOS (ABSOLUTE): 0.5 10*3/uL — ABNORMAL HIGH (ref 0.0–0.4)
Eos: 9 %
Hematocrit: 42.7 % (ref 34.0–46.6)
Hemoglobin: 14.5 g/dL (ref 11.1–15.9)
Immature Grans (Abs): 0 10*3/uL (ref 0.0–0.1)
Immature Granulocytes: 0 %
Lymphocytes Absolute: 1.8 10*3/uL (ref 0.7–3.1)
Lymphs: 33 %
MCH: 32.1 pg (ref 26.6–33.0)
MCHC: 34 g/dL (ref 31.5–35.7)
MCV: 95 fL (ref 79–97)
Monocytes Absolute: 0.5 10*3/uL (ref 0.1–0.9)
Monocytes: 10 %
Neutrophils Absolute: 2.5 10*3/uL (ref 1.4–7.0)
Neutrophils: 47 %
Platelets: 237 10*3/uL (ref 150–450)
RBC: 4.52 x10E6/uL (ref 3.77–5.28)
RDW: 12 % (ref 11.7–15.4)
WBC: 5.4 10*3/uL (ref 3.4–10.8)

## 2021-05-15 LAB — RPR: RPR Ser Ql: NONREACTIVE

## 2021-05-15 LAB — GC/CHLAMYDIA PROBE AMP
Chlamydia trachomatis, NAA: NEGATIVE
Neisseria Gonorrhoeae by PCR: NEGATIVE

## 2021-05-15 LAB — VITAMIN B12: Vitamin B-12: 289 pg/mL (ref 232–1245)

## 2021-05-15 LAB — HIV ANTIBODY (ROUTINE TESTING W REFLEX): HIV Screen 4th Generation wRfx: NONREACTIVE

## 2021-05-21 ENCOUNTER — Ambulatory Visit (INDEPENDENT_AMBULATORY_CARE_PROVIDER_SITE_OTHER): Payer: BC Managed Care – PPO | Admitting: *Deleted

## 2021-05-21 ENCOUNTER — Other Ambulatory Visit: Payer: Self-pay

## 2021-05-21 DIAGNOSIS — J4550 Severe persistent asthma, uncomplicated: Secondary | ICD-10-CM

## 2021-06-11 ENCOUNTER — Ambulatory Visit: Payer: Self-pay

## 2021-06-26 ENCOUNTER — Other Ambulatory Visit: Payer: Self-pay

## 2021-06-26 ENCOUNTER — Ambulatory Visit (INDEPENDENT_AMBULATORY_CARE_PROVIDER_SITE_OTHER): Payer: BC Managed Care – PPO | Admitting: *Deleted

## 2021-06-26 DIAGNOSIS — J4550 Severe persistent asthma, uncomplicated: Secondary | ICD-10-CM | POA: Diagnosis not present

## 2021-07-17 ENCOUNTER — Ambulatory Visit (INDEPENDENT_AMBULATORY_CARE_PROVIDER_SITE_OTHER): Payer: BC Managed Care – PPO | Admitting: *Deleted

## 2021-07-17 ENCOUNTER — Other Ambulatory Visit: Payer: Self-pay

## 2021-07-17 DIAGNOSIS — J4550 Severe persistent asthma, uncomplicated: Secondary | ICD-10-CM

## 2021-08-07 ENCOUNTER — Other Ambulatory Visit: Payer: Self-pay

## 2021-08-07 ENCOUNTER — Ambulatory Visit (INDEPENDENT_AMBULATORY_CARE_PROVIDER_SITE_OTHER): Payer: BC Managed Care – PPO | Admitting: *Deleted

## 2021-08-07 DIAGNOSIS — J4550 Severe persistent asthma, uncomplicated: Secondary | ICD-10-CM

## 2021-08-22 NOTE — Progress Notes (Signed)
FOLLOW UP Date of Service/Encounter:  08/23/21   Subjective:  Samantha Cole (DOB: 06-26-2001) is a 20 y.o. female [facility] in re-evaluation of the following: severe persistent asthma, food allergy and allergic rhinoconjunctivitis History obtained from: chart review and patient.  For Review, LV was on 03/14/21  with Nehemiah Settle, FNP seen for severe persistent asthma (on Xolair q3 weeks), anaphylaxis due ot food (milk, dairy, shellfish, fish, peanut, tree nuts, coconut, sesame seed, apple, and mango), and allergic rhinitis and conjunctivitis (Flonase PRN, Claritin or Allegra PRN).    Previous diagnostics IgE enviro + dust mites, cat, dog, grass pollens, tree pollens,weed pollens and cockroach 2019 IgE + mango (1.07), apple (1.17), coconut (17.30), sesame (29.30), peanut (2.20), hazelnut (35.60), [place] nut (4.12), almond (26.80), pecan (1.34), cashew (6.37), walnut (0.43), clam (0.48), shrimp (3.54), scallop (0.73), oyster (0.26), lobster (0.17), milk (12.70) IgE negative to fish panel ----------------------------------------------------------- She would like to talk about mast cell activation syndrome today because she has a second cousin who was diagnosed with this.   After she eats, feels she has a lump feeling in her throat and never has hives, but she is on Xolair.  She does have skin sensitivities and GI symptoms (bloating)-thought she maybe had IBS.  She does occasionally eat apples which don't seem to bother her. She avoids dairy, but recognizes that when she eats out, there is likely cross contamination. Otherwise avoids all foods in her list and no accidental exposures.  She feels her asthma is well-controlled.  Has only used rescue inhaler 1-2 times since last visit in setting of cold which was last week.  Reports breathing is well today.  No antibiotics or steroids since last visit.  She would like to find out about  self-administration of Xolair.  She does not have a latex allergy.   Allergies as of 08/23/2021       Reactions   Other Anaphylaxis   Tree nuts, Coconut, Sesame   Shellfish Allergy Anaphylaxis   Lactose Intolerance (gi)         Medication List        Accurate as of August 23, 2021 11:36 AM. If you have any questions, ask your nurse or doctor.          albuterol 108 (90 Base) MCG/ACT inhaler Commonly known as: VENTOLIN HFA Inhale 2 puffs every 4-6 hours as needed for coughing, wheezing, tightness in chest, or shortness of breath.   Azelastine-Fluticasone 137-50 MCG/ACT Susp Place 1 spray into the nose in the morning and at bedtime.   budesonide-formoterol 80-4.5 MCG/ACT inhaler Commonly known as: Symbicort Inhale 2 puffs into the lungs 2 (two) times daily. Started by: Tonny Bollman, MD   citalopram 40 MG tablet Commonly known as: CELEXA Take 1 tablet (40 mg total) by mouth daily.   EPINEPHrine 0.3 mg/0.3 mL Soaj injection Commonly known as: Auvi-Q Inject 0.3 mg into the muscle as needed for anaphylaxis.   famotidine 40 MG tablet Commonly known as: PEPCID Take 1 tablet (40 mg total) by mouth daily. Started by: Tonny Bollman, MD   loratadine 10 MG tablet Commonly known as: CLARITIN Take 10 mg by mouth daily.   multivitamin tablet Take 1 tablet by mouth daily.   Norgestimate-Ethinyl Estradiol Triphasic 0.18/0.215/0.25 MG-35 MCG tablet Commonly known as: Tri-Estarylla Take 1 tablet by mouth daily.   Xolair 150 MG injection Generic drug: omalizumab INJECT 375 MG UNDER THE SKIN EVERY 14 DAYS (DISCARD UNUSED PORTION)  Past Medical History:  Diagnosis Date   Abnormality on screening test 10/2016   when tried to donate blood, anti HepB Core Ab +   Angio-edema    Asthma age 80-9   Dr. Willa Rough   Multiple allergies    completed environmental immunotherapy ( with Dr. [place] Callas) and multiple food allergies   Recurrent upper respiratory infection (URI)     Urticaria    Past Surgical History:  Procedure Laterality Date   WISDOM TOOTH EXTRACTION  08/2016   Otherwise, there have been no changes to her past medical history, surgical history, family history, or social history.  ROS: All others negative except as noted per HPI.   Objective:  BP 118/72   Pulse 76   Temp 98.1 F (36.7 C) (Temporal)   Resp 16   Ht 5\' 8"  (1.727 m)   Wt 148 lb 3.2 oz (67.2 kg)   SpO2 99%   BMI 22.53 kg/m  Body mass index is 22.53 kg/m. Physical Exam: General Appearance:  Alert, cooperative, no distress, appears stated age  Head:  Normocephalic, without obvious abnormality, atraumatic  Eyes:  Conjunctiva clear, EOM's intact  Nose: Nares normal, hypertrophic turbinates  Throat: Lips, tongue normal; teeth and gums normal, normal posterior oropharynx  Neck: Supple, symmetrical  Lungs:   Course wheezing on end expiration, Respirations unlabored, no coughing  Heart:  RRR, no murmur, Appears well perfused  Extremities: No edema  Skin: Skin color, texture, turgor normal, no rashes or lesions on visualized portions of skin  Neurologic: No gross deficits   Spirometry:  Tracings reviewed. Her effort: Good reproducible efforts. FVC: 3.90L FEV1: 2.86L, 76% predicted FEV1/FVC ratio: 84% Interpretation: Spirometry consistent with mild obstructive disease.  Please see scanned spirometry results for details.  Assessment/Plan   Patient Instructions  Severe persistent asthma - with acute flare - breathing test today showed mild obstruction  Continue Xolair injections every 3 weeks.  (Message sent to biologics coordinator to determine eligibility for home administration) Start Symbicort 80, 2 puffs twice a day to be used during flares or with any respiratory illness for at least one week or until symptoms resolve Have access to albuterol inhaler 2 puffs every 4-6 hours as needed for cough/wheeze/shortness of breath/chest tightness.  May use 15-20 minutes prior to  activity.   Monitor frequency of use.    Food Allergy-stable   Continue avoidance of milk, dairy, shellfish, fish, peanut/tree nuts, coconut, sesame seed, and mango Okay to continue eating apple since you are tolerating   In case of an allergic reaction, give Benadryl 4 teaspoonfuls every 6 hours, and life-threatening  symptoms occur, inject with AuviQ 0.3 mg   follow emergency action plan in case of allergic reaction  Allergic rhinitis-controlled Continue avoidance measures for dust mites, cat, dog, grass pollens, tree pollens, weed pollens and  cockroach Continue with Claritin 10mg  daily as needed  Continue Flonase 1 spray each nostril twice a day as needed Continue Optivar eye drops 1 drop each eye twice a day as needed  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

## 2021-08-23 ENCOUNTER — Other Ambulatory Visit: Payer: Self-pay

## 2021-08-23 ENCOUNTER — Encounter: Payer: Self-pay | Admitting: Internal Medicine

## 2021-08-23 ENCOUNTER — Telehealth: Payer: Self-pay | Admitting: *Deleted

## 2021-08-23 ENCOUNTER — Ambulatory Visit (INDEPENDENT_AMBULATORY_CARE_PROVIDER_SITE_OTHER): Payer: BC Managed Care – PPO | Admitting: Internal Medicine

## 2021-08-23 VITALS — BP 118/72 | HR 76 | Temp 98.1°F | Resp 16 | Ht 68.0 in | Wt 148.2 lb

## 2021-08-23 DIAGNOSIS — H1013 Acute atopic conjunctivitis, bilateral: Secondary | ICD-10-CM

## 2021-08-23 DIAGNOSIS — J4550 Severe persistent asthma, uncomplicated: Secondary | ICD-10-CM

## 2021-08-23 DIAGNOSIS — T781XXD Other adverse food reactions, not elsewhere classified, subsequent encounter: Secondary | ICD-10-CM | POA: Diagnosis not present

## 2021-08-23 DIAGNOSIS — J301 Allergic rhinitis due to pollen: Secondary | ICD-10-CM

## 2021-08-23 DIAGNOSIS — T7800XD Anaphylactic reaction due to unspecified food, subsequent encounter: Secondary | ICD-10-CM

## 2021-08-23 DIAGNOSIS — K219 Gastro-esophageal reflux disease without esophagitis: Secondary | ICD-10-CM

## 2021-08-23 DIAGNOSIS — D8942 Idiopathic mast cell activation syndrome: Secondary | ICD-10-CM | POA: Diagnosis not present

## 2021-08-23 MED ORDER — FAMOTIDINE 40 MG PO TABS: 40.0000 mg | ORAL_TABLET | Freq: Every day | ORAL | 5 refills | Status: DC

## 2021-08-23 MED ORDER — ALBUTEROL SULFATE HFA 108 (90 BASE) MCG/ACT IN AERS: INHALATION_SPRAY | RESPIRATORY_TRACT | 1 refills | Status: DC

## 2021-08-23 MED ORDER — OMALIZUMAB 150 MG/ML ~~LOC~~ SOSY: 375.0000 mg | PREFILLED_SYRINGE | SUBCUTANEOUS | 11 refills | Status: DC

## 2021-08-23 MED ORDER — OMALIZUMAB 75 MG/0.5ML ~~LOC~~ SOSY: 375.0000 mg | PREFILLED_SYRINGE | SUBCUTANEOUS | 11 refills | Status: DC

## 2021-08-23 MED ORDER — BUDESONIDE-FORMOTEROL FUMARATE 80-4.5 MCG/ACT IN AERO: 2.0000 | INHALATION_SPRAY | Freq: Two times a day (BID) | RESPIRATORY_TRACT | 5 refills | Status: DC

## 2021-08-23 MED ORDER — EPINEPHRINE 0.3 MG/0.3ML IJ SOAJ: 0.3000 mg | INTRAMUSCULAR | 1 refills | Status: DC | PRN

## 2021-08-23 NOTE — Patient Instructions (Addendum)
Severe persistent asthma - with acute flare - breathing test today showed mild obstruction  Continue Xolair injections every 3 weeks.   Start Symbicort 80, 2 puffs twice a day to be used during flares or with any respiratory illness for at least one week or until symptoms resolve Have access to albuterol inhaler 2 puffs every 4-6 hours as needed for cough/wheeze/shortness of breath/chest tightness.  May use 15-20 minutes prior to activity.   Monitor frequency of use.    Food Allergy   Continue avoidance of milk, dairy, shellfish, fish, peanut/tree nuts, coconut, sesame seed, and mango Okay to continue eating apple since you are tolerating   In case of an allergic reaction, give Benadryl 4 teaspoonfuls every 6 hours, and life-threatening  symptoms occur, inject with AuviQ 0.3 mg   follow emergency action plan in case of allergic reaction  Allergic rhinitis Continue avoidance measures for dust mites, cat, dog, grass pollens, tree pollens,weed        pollens and  cockroach Continue with Claritin 10mg  daily as needed  Continue Flonase 1 spray each nostril twice a day as needed Continue Optivar eye drops 1 drop each eye twice a day as needed  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:  - we will screen for this with a tryptase level today - this is a very rare disease and is not genetic  Reflux: (feeling of lump in throat with meals and occasional heartburn) - start famotidine 40 mg daily - lifestyle modifications as below  Schedule a follow up appointment in 6 months or sooner if needed --------------------------------------------------------------------------- Gastroesophageal Reflux Induced Respiratory Disease and Laryngopharyngeal Reflux (LPR): Gastroesophageal reflux disease (GERD) is a condition where the contents of the stomach reflux or back up into the esophagus or swallowing tube.  This can result in a variety of clinical symptoms including classic symptoms and atypical symptoms.   Classic symptoms of GERD include: heartburn, chest pain, acid taste in the mouth, and difficulty in swallowing.  Atypical symptoms of GERD include laryngopharyngeal reflux (LPR) and asthma.  LPR occurs when stomach reflux comes all the way up to the throat.  Clinical symptoms include hoarseness, raspy voice, laryngitis, throat clearing, postnasal drip, mucus stuck in the throat, a sensation of a lump in the throat, sore throat, and cough.  Most patients with LPR do not have classic symptoms of GERD.  Asthma can also be triggered by GERD.  The acid stomach fluid can stimulate nerve fibers in the esophagus which can cause an increase in bronchial muscle tone and narrowing of the airways.  Acid stomach contents may also reflux into the trachea and bronchi of the lungs where it can trigger an asthma attack.  Many people with GERD triggered asthma do not have classic symptoms of GERD.  Diagnosis of LPR and GERD induced asthma is frequently made from a typical history and response to medications.  It may take several months of medications to see a good response.  Occasionally, a 24-hour esophageal pH probe study must be performed.  Treatment of GERD/LPR includes:   Modification of diet and lifestyle Stop smoking Avoid overeating and lose weight Avoid acidic and fatty foods, chocolate, onions, garlic, peppermint Elevate the head of your bed 6 to 8 inches with blocks or wedge Medications Zantac, Pepcid, Axid, Tagamet Prilosec, Prevacid, Aciphex, Protonix, Nexium Surgery

## 2021-08-23 NOTE — Telephone Encounter (Signed)
Called patient and advised will send Rx for Xolair 150mg  and Xolair 75mg  syringes to [facility] along with attestation for self admin they require.  Advised patient to bring her syringes in for next injection date for admn instruction.

## 2021-08-23 NOTE — Telephone Encounter (Signed)
-----   Message from Tonny Bollman, MD sent at 08/23/2021 11:35 AM EST ----- Hi [name] Client would like to transition to home dosing of Xolair.  She has no latex allergies.  She has no reactions to Xolair that I am aware of.

## 2021-08-25 LAB — TRYPTASE: Tryptase: 4.4 ug/L (ref 2.2–13.2)

## 2021-08-27 NOTE — Progress Notes (Signed)
Please let Samantha Cole know that her tryptase level was normal.   This is reassuring and would be unusual for someone with mast cell activation syndrome. It would also be inconsistent with hereditary alpha trypasemia syndrome which we briefly discussed in clinic (the genetic form of mast cell disorders).

## 2021-08-28 ENCOUNTER — Ambulatory Visit (INDEPENDENT_AMBULATORY_CARE_PROVIDER_SITE_OTHER): Payer: BC Managed Care – PPO | Admitting: *Deleted

## 2021-08-28 ENCOUNTER — Other Ambulatory Visit: Payer: Self-pay

## 2021-08-28 DIAGNOSIS — J4550 Severe persistent asthma, uncomplicated: Secondary | ICD-10-CM | POA: Diagnosis not present

## 2021-09-18 ENCOUNTER — Other Ambulatory Visit: Payer: Self-pay

## 2021-09-18 ENCOUNTER — Ambulatory Visit: Payer: BC Managed Care – PPO

## 2021-09-18 DIAGNOSIS — J4550 Severe persistent asthma, uncomplicated: Secondary | ICD-10-CM

## 2021-09-18 NOTE — Progress Notes (Signed)
Immunotherapy   Patient Details  Name: Samantha Cole MRN: [id number] Date of Birth: 2001/05/28  09/18/2021  [name] here to learn how to self administer. Patient was informed and showed how to self administer Xolair. Patient verbalized understanding and demonstrated correct administration. Patient will start giving her injections at home. Frequency: Every 2 weeks Epi-Pen:Epi-Pen Available  Consent signed and patient instructions given.   Dub Mikes 09/18/2021, 8:45 AM

## 2022-01-11 DIAGNOSIS — Z6823 Body mass index (BMI) 23.0-23.9, adult: Secondary | ICD-10-CM | POA: Diagnosis not present

## 2022-01-11 DIAGNOSIS — Z124 Encounter for screening for malignant neoplasm of cervix: Secondary | ICD-10-CM | POA: Diagnosis not present

## 2022-01-11 DIAGNOSIS — R079 Chest pain, unspecified: Secondary | ICD-10-CM | POA: Diagnosis not present

## 2022-02-11 ENCOUNTER — Telehealth: Payer: Self-pay | Admitting: *Deleted

## 2022-02-11 NOTE — Telephone Encounter (Signed)
Patient called with questions about balance for her Xolair with [facility]. I had assumed she had copay card but upon reaching out to them she did not and I signed her up forn one and gave to [facility] and they will file back 180 days for her medication cost. I called patient and advised this info ?

## 2022-02-15 ENCOUNTER — Other Ambulatory Visit: Payer: Self-pay | Admitting: Internal Medicine

## 2022-02-25 DIAGNOSIS — R051 Acute cough: Secondary | ICD-10-CM | POA: Diagnosis not present

## 2022-02-25 DIAGNOSIS — J4541 Moderate persistent asthma with (acute) exacerbation: Secondary | ICD-10-CM | POA: Diagnosis not present

## 2022-03-05 ENCOUNTER — Telehealth: Payer: Self-pay | Admitting: *Deleted

## 2022-03-05 NOTE — Telephone Encounter (Signed)
L/m for patient to contact clinic for MD appt for reapproval for Xolair

## 2022-05-01 ENCOUNTER — Other Ambulatory Visit: Payer: Self-pay | Admitting: Family Medicine

## 2022-05-01 ENCOUNTER — Other Ambulatory Visit: Payer: Self-pay | Admitting: *Deleted

## 2022-05-01 DIAGNOSIS — L700 Acne vulgaris: Secondary | ICD-10-CM

## 2022-05-01 MED ORDER — NORGESTIM-ETH ESTRAD TRIPHASIC 0.18/0.215/0.25 MG-35 MCG PO TABS: 1.0000 | ORAL_TABLET | Freq: Every day | ORAL | 1 refills | Status: DC

## 2022-05-01 NOTE — Telephone Encounter (Signed)
Called patient and she does need this medication-will be out the end of this week. I offered her several dates (after 8/1) to schedule CPE and she said she had to call back as she was waiting on work schedule and in the process of moving.

## 2022-05-01 NOTE — Telephone Encounter (Signed)
Ok to send in for 1 pack w/1 refill, and we will need to ensure that she schedules visit.  Not sure what this issue is why it can't be e-rx'd.  Please refill

## 2022-05-02 ENCOUNTER — Other Ambulatory Visit: Payer: Self-pay | Admitting: Internal Medicine

## 2022-05-21 ENCOUNTER — Other Ambulatory Visit: Payer: Self-pay | Admitting: Family Medicine

## 2022-05-21 DIAGNOSIS — F32A Depression, unspecified: Secondary | ICD-10-CM

## 2022-05-21 NOTE — Telephone Encounter (Signed)
Sent pt a mychart message advising that appt is needed

## 2022-05-23 ENCOUNTER — Encounter: Payer: Self-pay | Admitting: *Deleted

## 2022-05-26 ENCOUNTER — Other Ambulatory Visit: Payer: Self-pay | Admitting: Internal Medicine

## 2022-05-26 NOTE — Progress Notes (Signed)
Chief Complaint  Patient presents with   Annual Exam    Nonfasting annual exam with pap, did not have done in [place]. Saw UC and they thought CP was related to reflux, would like to discuss today. Will take Tdap today and will check insurance PP:IRJJOACZY vaccines-she doesn't recall having any and none in [facility]. No other concerns.   Samantha Cole is a 21 y.o. female who presents for a complete physical.    She saw FNP at [facility] in [place] in March with complaints of intermittent R sided chest pain. She had an EKG done, and was advised to continue famotidine and abstain from energy drinks. Heartburn resolved for a while, but starting to recurring the last 3-4 weeks. It is worse in the mornings.  Takes pepcid at night. She switched from energy drinks to coffee, but stopped that a few days ago.  Has coffee at eight am or noon.  Also started raising the [facility] (with pillows) the last few nights, which did help. Alcohol 2x/week, doesn't seem to be any worse after alcohol. +tomatoes  She was referred to OB-GYN for pap smear, but has not scheduled to see one. She will do pap smear here. Same partner as last year, using condoms and OCPs.  Contraception:  Patient is on OCP's for contraception (along with condoms), cramps and acne. Cycles are normal, no significant cramps.  Periods are light and shorter.  She is tolerating OCPs with any side effects--no nausea, migraines, no abnormal bleeding. Acne remains well controlled. Sexually active. 8 lifetime partner, uses condoms. Had full STD check last year.  Anxiety and Depression: She has been on citalopram since 05/2018, and on the 39m dose since 11/2019. She had seen therapist at school that year, not really needed since then. She is tolerating medications without side effects. She reports doing very well on the current dose.  She has asthma and allergies, [facility]. She is on Xolair q3 weeks and doing well.  She  gives these to herself now. Needed albuterol yesterday--due to having some dry cough at night that she thinks is related to her reflux, which is causing some wheezing. Uses Allegra as needed for allergies,  Azelastine-fluticasone spray--uses seasonally, hasn't started it yet.  She has tree-nut and shellfish allergy.  She has an Auvi-Q pen.  B12 level was low in 07/2020, at 267. She recalls taking B12 for a bit, didn't continue it.  Level was 289 in 05/2021. Normal CBC. A multivitamin was recommended (with B12 and D3) She hasn't taken any MVI in about 6 weeks.  Lab Results  Component Value Date   [id number] 93208/10/2020   Lab Results  Component Value Date   WBC 5.4 05/14/2021   HGB 14.5 05/14/2021   HCT 42.7 05/14/2021   MCV 95 05/14/2021   PLT 237 05/14/2021      Immunization History  Administered Date(s) Administered   DTaP 10/30/2000, 05/19/2001, 12/10/2002, 06/19/2004, 01/23/2006   HPV 9-valent 07/22/2018, 04/21/2019   HPV Quadrivalent 01/14/2019   Hepatitis A 12/10/2002, 06/19/2004   Hepatitis B 101-06-01 08/28/2000, 03/06/2001   HiB (PRP-OMP) 10/30/2000, 01/01/2001, 03/06/2001, 12/10/2002   IPV 01/01/2001, 03/06/2001, 04/29/2001, 01/23/2006   Influenza Split 08/10/2012   Influenza,inj,Quad PF,6+ Mos 09/15/2013, 08/17/2014, 09/04/2015, 08/12/2016, 08/15/2017, 07/22/2018, 07/20/2019, 08/13/2020   Influenza-Unspecified 06/28/2021   MMR 12/10/2002, 01/23/2006   Meningococcal B, OMV 11/10/2017, 04/21/2019   Meningococcal Conjugate 03/29/2015   Meningococcal Mcv4o 11/10/2017   Moderna Sars-Covid-2 Vaccination 01/07/2020, 02/07/2020,  01/04/2021   Pneumococcal-Unspecified 10/30/2000, 06/19/2004   Tdap 06/09/2012   Vitamin D-OH level of 38.3 in 10/2019 Lipid screen: Lab Results  Component Value Date   CHOL 146 03/29/2015   HDL 72 03/29/2015   LDLCALC 53 03/29/2015   TRIG 105 03/29/2015   CHOLHDL 2.0 03/29/2015   Last Pap smear: never Dentist: twice a year Ophtho:  yearly Exercise: 3-4 days/week  walks 1 mile and at-home exercise (mountain climbers, crunches).  She has no gym access over the summer. Typical routine at school is walking, gym 3x/week--weights.   No longer eats dairy. Drinks oat milk or soy milk.  Well child information reviewed in chart--notable for squinting, alcohol/marijuana use, sexual intercourse, 5 hours of screen time.   PMH, PSH, SH and FH reviewed and updated  Outpatient Encounter Medications as of 05/27/2022  Medication Sig Note   albuterol (VENTOLIN HFA) 108 (90 Base) MCG/ACT inhaler INHALE 2 PUFFS EVERY 4-6 HOURS AS NEEDED FOR COUGHING, WHEEZING, TIGHTNESS IN CHEST, OR SHORTNESS OF BREATH. 05/27/2022: Used last evening   citalopram (CELEXA) 40 MG tablet Take 1 tablet (40 mg total) by mouth daily.    famotidine (PEPCID) 40 MG tablet TAKE 1 TABLET BY MOUTH EVERY DAY    fexofenadine (ALLEGRA) 180 MG tablet Take 180 mg by mouth daily.    Norgestimate-Ethinyl Estradiol Triphasic (TRI-ESTARYLLA) 0.18/0.215/0.25 MG-35 MCG tablet Take 1 tablet by mouth daily.    omalizumab Arvid Right) 150 MG/ML prefilled syringe Inject 375 mg into the skin every 14 (fourteen) days. Using 2-181m syringes and one 784msyringe every 14 days    omalizumab (XArvid Right75 MG/0.5ML prefilled syringe Inject 375 mg into the skin every 14 (fourteen) days. Using one 7538mrefilled syringe and 2 150m39mery 14 days    Azelastine-Fluticasone 137-50 MCG/ACT SUSP Place 1 spray into the nose in the morning and at bedtime. (Patient not taking: Reported on 05/27/2022) 05/27/2022: Ran out-needs refill   budesonide-formoterol (SYMBICORT) 80-4.5 MCG/ACT inhaler Inhale 2 puffs into the lungs 2 (two) times daily. (Patient not taking: Reported on 05/27/2022) 05/27/2022: As needed   EPINEPHrine (AUVI-Q) 0.3 mg/0.3 mL IJ SOAJ injection Inject 0.3 mg into the muscle as needed for anaphylaxis. (Patient not taking: Reported on 05/27/2022)    Multiple Vitamin (MULTIVITAMIN) tablet Take 1  tablet by mouth daily. (Patient not taking: Reported on 05/27/2022)    [DISCONTINUED] loratadine (CLARITIN) 10 MG tablet Take 10 mg by mouth daily.    Facility-Administered Encounter Medications as of 05/27/2022  Medication   omalizumab (XOLArvid Rightjection 375 mg   Allergies  Allergen Reactions   Other Anaphylaxis    Tree nuts, Coconut, Sesame   Shellfish Allergy Anaphylaxis   Lactose Intolerance (Gi)    Mangifera Indica Fruit Ext (Mango) [Mangifera Indica]    Milk Protein     ROS: No fever, chills, headaches, dizziness, fainting spells, URI symptoms, cough, shortness of breath, chest pain, palpitations, nausea, vomiting, bowel changes, urinary complaints. Periods are regular. No bleeding, bruising, rash, joint pains or other concerns. Acne is well controlled. Moods are controlled.  Allergies are controlled. Asthma is flaring some with her reflux. Waking up wheezing at night. +Weight gain--she admits to eating out more since May. +sugar in coffee. Infrequent OJ, alcohol once or twice a week. Has some intermittent pain at R chest with deep breaths.   PHYSICAL EXAM:  BP 110/70   Pulse 68   Ht '5\' 7"'  (1.702 m)   Wt 171 lb (77.6 kg)   BMI 26.78 kg/m   Wt  Readings from Last 3 Encounters:  05/27/22 171 lb (77.6 kg)  08/23/21 148 lb 3.2 oz (67.2 kg)  05/14/21 146 lb 9.6 oz (66.5 kg)   Well developed, pleasant, cooperative female in no distress HEENT: PERRL, EOMI, conjunctiva clear.  TM's and EAC's normal. Nose with small amount of clear mucus. No sinus tenderness. OP clear Neck: no lymphadenopathy, thyromegaly or mass Heart: regular rate and rhythm without murmur Lungs: clear bilaterally.  Good air movement. No wheezes, rales, ronchi Abdomen: soft, nontender, no organomegaly or mass Back: no spinal tenderness, scoliosis or CVA tenderness Chest wall: nontender Breast exam: no nipple discharge, inversion, no skin dimpling, breast masses or axillary lymphadenopathy GU: normal  external genitalia, no lesions. No abnormal vaginal discharge.  Cervix was very deep and posterior--hard time visualizing cervix adequately.  Pap was obtained. No cervical motion tenderness. No uterine or adnexal tenderness or masses. Extremities: no clubbing, cyanosis or edema.  2+ pulses Skin: no rashes, suspicious lesions or bruising. Multiple tattoos present. Psych: normal mood, affect, hygiene and grooming Neuro: alert and oriented. Normal strength, sensation, gait, and DTR's 2+ and symmetric.      05/27/2022    1:25 PM 05/14/2021    1:55 PM 05/10/2020   10:23 AM 05/10/2020    9:25 AM 03/02/2020   12:21 PM  Depression screen PHQ 2/9  Decreased Interest 0 1 0 0 0  Down, Depressed, Hopeless 0 1 0 0 1  PHQ - 2 Score 0 2 0 0 1  Altered sleeping 0 2 0  0  Tired, decreased energy 1 0 0  0  Change in appetite 0 0 0  1  Feeling bad or failure about yourself  0 0 0  0  Trouble concentrating 0 2 0  0  Moving slowly or fidgety/restless 0 0 0  0  Suicidal thoughts 0 0 0  0  PHQ-9 Score 1 6 0  2  Difficult doing work/chores Not difficult at all Not difficult at all         05/27/2022    1:26 PM 05/14/2021    2:21 PM 05/10/2020   10:23 AM 03/02/2020   12:25 PM  GAD 7 : Generalized Anxiety Score  Nervous, Anxious, on Edge 1 0 0 0  Control/stop worrying 0 0 0 0  Worry too much - different things 0 0 0 0  Trouble relaxing 0 0 0 0  Restless 0 1 0 0  Easily annoyed or irritable 3 1 0 0  Afraid - awful might happen 0 0 0 0  Total GAD 7 Score 4 2 0 0  Anxiety Difficulty Not difficult at all       ASSESSMENT/PLAN:    If inadequate cells on pap smear, repeat next year (may need abdominal pressure, longer speculum).  Pt advised to resume MVI.  Check labs. RF citalopram, OCP. Encouraged to continue condom use.   Discussed monthly self breast exams, at least 30 minutes of aerobic activity at least 5 days/week, weight-bearing exercise at least 2x/week; proper sunscreen use reviewed; healthy  diet, including goals of calcium and vitamin D intake; regular seatbelt use; safe sex, avoidance of excessive alcohol, recreational drugs.  Immunization recommendations discussed, Tdap given today. To consider pneumovax vs Prevnar-20 (need to check insurance coverage). Updated COVID booster recommended when available. Yearly flu shots recommended.   F/u 1 year, sooner prn.

## 2022-05-26 NOTE — Patient Instructions (Addendum)
Continue to get yearly flu shots. I recommend getting the updated bivalent COVID booster when it becomes available (should be by the end of next month).  Consider switching your pepcid to 20mg  twice daily, and if not effective in preventing reflux, you can switch to prilosec OTC 20mg  once daly (take before dinner, since your symptoms are mainly in the morning). If this doesn't adequately control your symptoms, let know and we can prescribe a higher dose.  Postnasal drainage may also be contributing, so use your allergy medications. Continue to elevate the head of the bed.  A pneumonia vaccine is recommended (due to your asthma--your last one was in 2005).  You should check with your insurance to see if Prevnar-20 is covered. If not, we can give pneumovax. This needs to be at least 2 weeks from the tetanus booster given today.

## 2022-05-27 ENCOUNTER — Other Ambulatory Visit: Payer: Self-pay | Admitting: Internal Medicine

## 2022-05-27 ENCOUNTER — Ambulatory Visit (INDEPENDENT_AMBULATORY_CARE_PROVIDER_SITE_OTHER): Payer: BC Managed Care – PPO | Admitting: Family Medicine

## 2022-05-27 ENCOUNTER — Encounter: Payer: Self-pay | Admitting: Family Medicine

## 2022-05-27 ENCOUNTER — Other Ambulatory Visit (HOSPITAL_COMMUNITY)
Admission: RE | Admit: 2022-05-27 | Discharge: 2022-05-27 | Disposition: A | Discharge status: Home / Self Care | Payer: BC Managed Care – PPO | Source: Ambulatory Visit | Attending: Family Medicine | Admitting: Family Medicine

## 2022-05-27 VITALS — BP 110/70 | HR 68 | Ht 67.0 in | Wt 171.0 lb

## 2022-05-27 DIAGNOSIS — L700 Acne vulgaris: Secondary | ICD-10-CM

## 2022-05-27 DIAGNOSIS — Z124 Encounter for screening for malignant neoplasm of cervix: Secondary | ICD-10-CM | POA: Diagnosis not present

## 2022-05-27 DIAGNOSIS — Z0000 Encounter for general adult medical examination without abnormal findings: Secondary | ICD-10-CM | POA: Insufficient documentation

## 2022-05-27 DIAGNOSIS — F419 Anxiety disorder, unspecified: Secondary | ICD-10-CM | POA: Diagnosis not present

## 2022-05-27 DIAGNOSIS — Z6826 Body mass index (BMI) 26.0-26.9, adult: Secondary | ICD-10-CM

## 2022-05-27 DIAGNOSIS — E559 Vitamin D deficiency, unspecified: Secondary | ICD-10-CM

## 2022-05-27 DIAGNOSIS — K219 Gastro-esophageal reflux disease without esophagitis: Secondary | ICD-10-CM

## 2022-05-27 DIAGNOSIS — E538 Deficiency of other specified B group vitamins: Secondary | ICD-10-CM | POA: Diagnosis not present

## 2022-05-27 DIAGNOSIS — J45901 Unspecified asthma with (acute) exacerbation: Secondary | ICD-10-CM

## 2022-05-27 DIAGNOSIS — Z23 Encounter for immunization: Secondary | ICD-10-CM | POA: Diagnosis not present

## 2022-05-27 DIAGNOSIS — F32A Depression, unspecified: Secondary | ICD-10-CM

## 2022-05-27 LAB — POCT URINALYSIS DIP (PROADVANTAGE DEVICE)
Bilirubin, UA: NEGATIVE
Blood, UA: NEGATIVE
Glucose, UA: NEGATIVE mg/dL
Ketones, POC UA: NEGATIVE mg/dL
Leukocytes, UA: NEGATIVE
Nitrite, UA: NEGATIVE
Protein: NEGATIVE
Specific Gravity, Urine: 1.01
Urobilinogen, Ur: NEGATIVE
pH, UA: 6 (ref 5.0–8.0)

## 2022-05-27 MED ORDER — NORGESTIM-ETH ESTRAD TRIPHASIC 0.18/0.215/0.25 MG-35 MCG PO TABS: 1.0000 | ORAL_TABLET | Freq: Every day | ORAL | 3 refills | Status: DC

## 2022-05-27 MED ORDER — ALBUTEROL SULFATE HFA 108 (90 BASE) MCG/ACT IN AERS: 2.0000 | INHALATION_SPRAY | RESPIRATORY_TRACT | 1 refills | Status: AC | PRN

## 2022-05-27 MED ORDER — CITALOPRAM HYDROBROMIDE 40 MG PO TABS: 40.0000 mg | ORAL_TABLET | Freq: Every day | ORAL | 3 refills | Status: DC

## 2022-05-28 ENCOUNTER — Encounter: Payer: Self-pay | Admitting: Family Medicine

## 2022-05-28 LAB — CBC WITH DIFFERENTIAL/PLATELET
Basophils Absolute: 0.1 10*3/uL (ref 0.0–0.2)
Basos: 1 %
EOS (ABSOLUTE): 1 10*3/uL — ABNORMAL HIGH (ref 0.0–0.4)
Eos: 14 %
Hematocrit: 39 % (ref 34.0–46.6)
Hemoglobin: 12.9 g/dL (ref 11.1–15.9)
Immature Grans (Abs): 0 10*3/uL (ref 0.0–0.1)
Immature Granulocytes: 0 %
Lymphocytes Absolute: 1.9 10*3/uL (ref 0.7–3.1)
Lymphs: 28 %
MCH: 27.1 pg (ref 26.6–33.0)
MCHC: 33.1 g/dL (ref 31.5–35.7)
MCV: 82 fL (ref 79–97)
Monocytes Absolute: 0.6 10*3/uL (ref 0.1–0.9)
Monocytes: 9 %
Neutrophils Absolute: 3.2 10*3/uL (ref 1.4–7.0)
Neutrophils: 48 %
Platelets: 343 10*3/uL (ref 150–450)
RBC: 4.76 x10E6/uL (ref 3.77–5.28)
RDW: 14.4 % (ref 11.7–15.4)
WBC: 6.7 10*3/uL (ref 3.4–10.8)

## 2022-05-28 LAB — VITAMIN B12: Vitamin B-12: 280 pg/mL (ref 232–1245)

## 2022-05-28 LAB — VITAMIN D 25 HYDROXY (VIT D DEFICIENCY, FRACTURES): Vit D, 25-Hydroxy: 49.5 ng/mL (ref 30.0–100.0)

## 2022-05-30 LAB — CYTOLOGY - PAP
Adequacy: ABSENT
Chlamydia: NEGATIVE
Comment: NEGATIVE
Comment: NORMAL
Diagnosis: NEGATIVE
Neisseria Gonorrhea: NEGATIVE

## 2022-06-19 ENCOUNTER — Encounter: Payer: Self-pay | Admitting: Internal Medicine

## 2022-07-23 ENCOUNTER — Encounter: Payer: Self-pay | Admitting: Internal Medicine

## 2022-08-12 ENCOUNTER — Other Ambulatory Visit: Payer: Self-pay | Admitting: Internal Medicine

## 2022-08-12 NOTE — Telephone Encounter (Signed)
Can we get her a 1 year FU? She needs Xolair reapproval. Thanks.

## 2022-08-27 ENCOUNTER — Other Ambulatory Visit: Payer: Self-pay | Admitting: *Deleted

## 2022-08-27 MED ORDER — FAMOTIDINE 40 MG PO TABS: 40.0000 mg | ORAL_TABLET | Freq: Every day | ORAL | 0 refills | Status: DC

## 2022-09-02 NOTE — Progress Notes (Unsigned)
FOLLOW UP Date of Service/Encounter:  09/02/22   Subjective:  Samantha Cole (DOB: 2001/05/30) is a 21 y.o. female who [facility] in re-evaluation of the following:  severe persistent asthma on Xolair, food allergy and allergic rhinoconjunctivitis  History obtained from: chart review and {Persons; PED relatives w/patient:19415::"patient"}.  For Review, LV was on 08/23/21  with Dr.Maxemiliano Riel seen for routine follow-up.  Reported lump in her throat after eating and was concerned about mast activation syndrome after it first cousin was diagnosed with this.  Her tryptase was obtained and normal.  Her breathing was controlled on Xolair every 3 weeks.  She was interested in self administration.  Her FEV1 is 76% predicted at last visit.  We did start her on Symbicort 80 to be used during flares.  We started famotidine due to her description of feeling a lump in her throat after eating. -------------------------------------------------------- Previous diagnostics Allergic rhinitis: IgE enviro + dust mites, cat, dog, grass pollens, tree pollens,weed pollens and cockroach Food allergy:  2019 IgE + mango (1.07), apple (1.17)-occasionally eats and tolerates, coconut (17.30), sesame (29.30), peanut (2.20), hazelnut (35.60), [place] nut (4.12), almond (26.80), pecan (1.34), cashew (6.37), walnut (0.43), clam (0.48), shrimp (3.54), scallop (0.73), oyster (0.26), lobster (0.17), milk (12.70) IgE negative to fish panel Normal tryptase 2022 Asthma: Controlled on Xolair every 3 weeks, doing home injections since 2022. -----------------------------------  Today presents for follow-up. ***  Allergies as of 09/04/2022       Reactions   Other Anaphylaxis   Tree nuts, Coconut, Sesame   Shellfish Allergy Anaphylaxis   Lactose Intolerance (gi)    Mangifera Indica Fruit Ext (mango) [mangifera Indica]    Milk Protein         Medication List        Accurate as of  September 02, 2022  1:09 PM. If you have any questions, ask your nurse or doctor.          albuterol 108 (90 Base) MCG/ACT inhaler Commonly known as: VENTOLIN HFA Inhale 2 puffs into the lungs every 4 (four) hours as needed for wheezing or shortness of breath.   Azelastine-Fluticasone 137-50 MCG/ACT Susp Place 1 spray into the nose in the morning and at bedtime.   budesonide-formoterol 80-4.5 MCG/ACT inhaler Commonly known as: Symbicort Inhale 2 puffs into the lungs 2 (two) times daily.   citalopram 40 MG tablet Commonly known as: CELEXA Take 1 tablet (40 mg total) by mouth daily.   EPINEPHrine 0.3 mg/0.3 mL Soaj injection Commonly known as: Auvi-Q Inject 0.3 mg into the muscle as needed for anaphylaxis.   famotidine 40 MG tablet Commonly known as: PEPCID Take 1 tablet (40 mg total) by mouth daily.   fexofenadine 180 MG tablet Commonly known as: ALLEGRA Take 180 mg by mouth daily.   multivitamin tablet Take 1 tablet by mouth daily.   Norgestimate-Ethinyl Estradiol Triphasic 0.18/0.215/0.25 MG-35 MCG tablet Commonly known as: Tri-Estarylla Take 1 tablet by mouth daily.   omalizumab 150 MG/ML prefilled syringe Commonly known as: Xolair Inject 375 mg into the skin every 14 (fourteen) days. Using 2-150mg  syringes and one 75mg  syringe every 14 days   omalizumab 75 MG/0.5ML prefilled syringe Commonly known as: Xolair Inject 375 mg into the skin every 14 (fourteen) days. Using one 75mg  prefilled syringe and 2 150mg  every 14 days       Past Medical History:  Diagnosis Date   Abnormality on screening test 10/2016   when tried to  donate blood, anti HepB Core Ab +   Allergy    Angio-edema    Anxiety    Asthma age 16-9   Dr. Willa Rough   Depression    GERD (gastroesophageal reflux disease)    Multiple allergies    completed environmental immunotherapy ( with Dr. [place] Callas) and multiple food allergies   Recurrent upper respiratory infection (URI)    Urticaria    Past  Surgical History:  Procedure Laterality Date   WISDOM TOOTH EXTRACTION  08/2016   Otherwise, there have been no changes to her past medical history, surgical history, family history, or social history.  ROS: All others negative except as noted per HPI.   Objective:  There were no vitals taken for this visit. There is no height or weight on file to calculate BMI. Physical Exam: General Appearance:  Alert, cooperative, no distress, appears stated age  Head:  Normocephalic, without obvious abnormality, atraumatic  Eyes:  Conjunctiva clear, EOM's intact  Nose: Nares normal, {Blank multiple:19196:a:"***","hypertrophic turbinates","normal mucosa","no visible anterior polyps","septum midline"}  Throat: Lips, tongue normal; teeth and gums normal, {Blank multiple:19196:a:"***","normal posterior oropharynx","tonsils 2+","tonsils 3+","no tonsillar exudate","+ cobblestoning"}  Neck: Supple, symmetrical  Lungs:   {Blank multiple:19196:a:"***","clear to auscultation bilaterally","end-expiratory wheezing","wheezing throughout"}, Respirations unlabored, {Blank multiple:19196:a:"***","no coughing","intermittent dry coughing"}  Heart:  {Blank multiple:19196:a:"***","regular rate and rhythm","no murmur"}, Appears well perfused  Extremities: No edema  Skin: Skin color, texture, turgor normal, no rashes or lesions on visualized portions of skin  Neurologic: No gross deficits   Reviewed: ***  Spirometry:  Tracings reviewed. Her effort: {Blank single:19197::"Good reproducible efforts.","It was hard to get consistent efforts and there is a question as to whether this reflects a maximal maneuver.","Poor effort, data can not be interpreted.","Variable effort-results affected.","decent for first attempt at spirometry."} FVC: ***L FEV1: ***L, ***% predicted FEV1/FVC ratio: ***% Interpretation: {Blank single:19197::"Spirometry consistent with mild obstructive disease","Spirometry consistent with moderate  obstructive disease","Spirometry consistent with severe obstructive disease","Spirometry consistent with possible restrictive disease","Spirometry consistent with mixed obstructive and restrictive disease","Spirometry uninterpretable due to technique","Spirometry consistent with normal pattern","No overt abnormalities noted given today's efforts"}.  Please see scanned spirometry results for details.  Skin Testing: {Blank single:19197::"Select foods","Environmental allergy panel","Environmental allergy panel and select foods","Food allergy panel","None","Deferred due to recent antihistamines use","deferred due to recent reaction"}. ***Adequate positive and negative controls Results discussed with patient/family.   {Blank single:19197::"Allergy testing results were read and interpreted by myself, documented by clinical staff."," "}  Assessment/Plan   ***  Tonny Bollman, MD  [facility]

## 2022-09-04 ENCOUNTER — Other Ambulatory Visit: Payer: Self-pay

## 2022-09-04 ENCOUNTER — Encounter: Payer: Self-pay | Admitting: Family Medicine

## 2022-09-04 ENCOUNTER — Ambulatory Visit: Payer: BC Managed Care – PPO | Admitting: Family Medicine

## 2022-09-04 VITALS — BP 122/80 | HR 72 | Temp 98.2°F | Resp 16 | Wt 174.3 lb

## 2022-09-04 DIAGNOSIS — H1013 Acute atopic conjunctivitis, bilateral: Secondary | ICD-10-CM | POA: Diagnosis not present

## 2022-09-04 DIAGNOSIS — J4550 Severe persistent asthma, uncomplicated: Secondary | ICD-10-CM

## 2022-09-04 DIAGNOSIS — J3089 Other allergic rhinitis: Secondary | ICD-10-CM

## 2022-09-04 DIAGNOSIS — T7800XD Anaphylactic reaction due to unspecified food, subsequent encounter: Secondary | ICD-10-CM

## 2022-09-04 DIAGNOSIS — J302 Other seasonal allergic rhinitis: Secondary | ICD-10-CM

## 2022-09-04 DIAGNOSIS — K219 Gastro-esophageal reflux disease without esophagitis: Secondary | ICD-10-CM

## 2022-09-04 MED ORDER — AZELASTINE-FLUTICASONE 137-50 MCG/ACT NA SUSP: 1.0000 | Freq: Two times a day (BID) | NASAL | 5 refills | Status: AC

## 2022-09-04 MED ORDER — BUDESONIDE-FORMOTEROL FUMARATE 80-4.5 MCG/ACT IN AERO
2.0000 | INHALATION_SPRAY | Freq: Two times a day (BID) | RESPIRATORY_TRACT | 5 refills | Status: DC
Start: 2022-09-04 — End: 2023-07-02

## 2022-09-04 NOTE — Progress Notes (Signed)
[address] [place] [address] Dept: [phone]  FOLLOW UP NOTE  Patient ID: Samantha Cole, female    DOB: 25-Mar-2001  Age: 22 y.o. MRN: [id number] Date of Office Visit: 09/04/2022  Assessment  Chief Complaint: Follow-up and Medication Refill  HPI Samantha Cole is a 21 year old female who presents to the clinic for a follow up visit. She was last seen in this clinic on 08/23/2021 by Dr. Maurine Minister for evaluation of asthma, allergic rhinitis, allergic conjunctivitis, reflux, and food allergy to milk, shellfish, fish, peanut, tree nut, coconut, sesame, mango, and apple.  At today's visit, she reports that her asthma has been well controlled with no shortness of breath or wheeze with activity or rest.  She does report occasional cough producing clear mucus.  She continues Symbicort 80 and albuterol only with illness.  She continues Xolair 375 mg injections once every 21 days with no large or local reactions.  She reports a significant decrease in her symptoms of asthma while continuing on Xolair injections.  She has not previously tried any Biologics for asthma control other than Xolair.  She feels very comfortable with Xolair and is not interested in changing to a different biologic for asthma control at this time.  Allergic rhinitis is reported as well controlled with intermittent postnasal drainage as the main symptom.  She continues an over-the-counter antihistamine as needed and rarely uses nasal sprays.  She began allergen immunotherapy at about age 73 and continued through age 35 when she began Xolair injections.  Allergic conjunctivitis is reported as well controlled with occasional symptoms for which she uses an allergy eyedrop with relief of symptoms.  Reflux is reported as moderately well controlled with heartburn occurring several days a week.  She continues famotidine 40 mg once a day and is motivated to change her diet and eating habits in order to reduce heartburn symptoms.  She reports  that her symptoms are frequently driven by food and late-night eating.  She continues to take Celexa.  She continues to avoid milk, shellfish, fish, peanut, tree nut, coconut, sesame, and mango.  She reports that she can eat apple if it is peeled.  She denies accidental ingestion or EpiPen use since her last visit to this clinic.  Her current medications are listed in the chart.   Drug Allergies:  Allergies  Allergen Reactions   Other Anaphylaxis    Tree nuts, Coconut, Sesame   Shellfish Allergy Anaphylaxis   Lactose Intolerance (Gi)    Mangifera Indica Fruit Ext (Mango) [Mangifera Indica]    Milk Protein     Physical Exam: BP 122/80   Pulse 72   Temp 98.2 F (36.8 C)   Resp 16   Wt 174 lb 4.8 oz (79.1 kg)   SpO2 97%   BMI 27.30 kg/m    Physical Exam Vitals reviewed.  Constitutional:      Appearance: Normal appearance.  HENT:     Head: Normocephalic and atraumatic.     Right Ear: Tympanic membrane normal.     Left Ear: Tympanic membrane normal.     Nose:     Comments: Bilateral nares slightly erythematous with clear nasal drainage noted.  Pharynx normal.  Ears normal.  Eyes normal.    Mouth/Throat:     Pharynx: Oropharynx is clear.  Eyes:     Conjunctiva/sclera: Conjunctivae normal.  Cardiovascular:     Rate and Rhythm: Normal rate and regular rhythm.     Heart sounds: Normal heart sounds. No  murmur heard. Pulmonary:     Effort: Pulmonary effort is normal.     Breath sounds: Normal breath sounds.     Comments: Lungs clear to auscultation Musculoskeletal:        General: Normal range of motion.     Cervical back: Normal range of motion and neck supple.  Skin:    General: Skin is warm and dry.  Neurological:     Mental Status: She is alert and oriented to person, place, and time.  Psychiatric:        Mood and Affect: Mood normal.        Behavior: Behavior normal.        Thought Content: Thought content normal.        Judgment: Judgment normal.      Diagnostics: FVC 3.70, FEV1 2.70.  Predicted FVC 4.37, predicted FEV1 3.77.  Spirometry indicates possible obstruction.  Postbronchodilator FVC 3.96, FEV1 3.06.  Postbronchodilator spirometry indicates normal ventilatory function with 13% improvement in FEV1.  Assessment and Plan: 1. Not well controlled severe persistent asthma   2. Seasonal and perennial allergic rhinitis   3. Allergic conjunctivitis of both eyes   4. Anaphylactic shock due to food, subsequent encounter   5. Gastroesophageal reflux disease, unspecified whether esophagitis present     No orders of the defined types were placed in this encounter.   Patient Instructions  Asthma Begin Symbicort 80-2 puffs twice a day with a spacer to prevent cough or wheeze Continue albuterol 2 puffs once every 4 hours as needed for cough or wheeze You may take 2 puffs of albuterol 5 to 15 minutes before activity to decrease cough or wheeze  Allergic rhinitis Continue allergen avoidance measures directed toward grass pollen, tree pollen, weed pollen, cockroach, dust mite, cat, and dog as listed below Continue an over-the-counter antihistamine once a day as needed for runny nose or itch Continue azelastine-fluticasone 2 sprays in each nostril once or twice a day as needed for nasal symptoms. In the right nostril, point the applicator out toward the right ear. In the left nostril, point the applicator out toward the left ear Consider saline nasal rinses as needed for nasal symptoms. Use this before any medicated nasal sprays for best result  Allergic conjunctivitis Some over the counter eye drops include Pataday one drop in each eye once a day as needed for red, itchy eyes OR Zaditor one drop in each eye twice a day as needed for red itchy eyes.  Reflux Continue famotidine 40 mg once a day as previously prescribed Continue dietary and lifestyle modifications as listed below  Food allergy Continue to avoid milk, shellfish, fish,  peanut, tree nut, coconut, sesame, and mango.  In case of an allergic reaction, take Benadryl 40 mg every 4 hours, and if life-threatening symptoms occur, inject with EpiPen 0.3 mg.  Call the clinic if this treatment plan is not working well for you.  Follow up in 2 months (in the clinic or televisit) or sooner if needed.    Return in about 2 months (around 11/04/2022), or if symptoms worsen or fail to improve.    Thank you for the opportunity to care for this patient.  Please do not hesitate to contact me with questions.  Thermon Leyland, FNP [facility]

## 2022-09-04 NOTE — Patient Instructions (Addendum)
Asthma Begin Symbicort 80-2 puffs twice a day with a spacer to prevent cough or wheeze Continue albuterol 2 puffs once every 4 hours as needed for cough or wheeze You may take 2 puffs of albuterol 5 to 15 minutes before activity to decrease cough or wheeze  Allergic rhinitis Continue allergen avoidance measures directed toward grass pollen, tree pollen, weed pollen, cockroach, dust mite, cat, and dog as listed below Continue an over-the-counter antihistamine once a day as needed for runny nose or itch Continue azelastine-fluticasone 2 sprays in each nostril once or twice a day as needed for nasal symptoms. In the right nostril, point the applicator out toward the right ear. In the left nostril, point the applicator out toward the left ear Consider saline nasal rinses as needed for nasal symptoms. Use this before any medicated nasal sprays for best result  Allergic conjunctivitis Some over the counter eye drops include Pataday one drop in each eye once a day as needed for red, itchy eyes OR Zaditor one drop in each eye twice a day as needed for red itchy eyes.  Reflux Continue famotidine 40 mg once a day as previously prescribed Continue dietary and lifestyle modifications as listed below  Food allergy Continue to avoid milk, shellfish, fish, peanut, tree nut, coconut, sesame, and mango.  In case of an allergic reaction, take Benadryl 40 mg every 4 hours, and if life-threatening symptoms occur, inject with EpiPen 0.3 mg.  Call the clinic if this treatment plan is not working well for you.  Follow up in 2 months (in the clinic or televisit) or sooner if needed.    Lifestyle Changes for Controlling GERD When you have GERD, stomach acid feels as if it's backing up toward your mouth. Whether or not you take medication to control your GERD, your symptoms can often be improved with lifestyle changes.   Raise Your Head Reflux is more likely to strike when you're lying down flat, because  stomach fluid can flow backward more easily. Raising the head of your bed 4-6 inches can help. To do this: Slide blocks or books under the legs at the head of your bed. Or, place a wedge under the mattress. Many foam stores can make a suitable wedge for you. The wedge should run from your waist to the top of your head. Don't just prop your head on several pillows. This increases pressure on your stomach. It can make GERD worse.  Watch Your Eating Habits Certain foods may increase the acid in your stomach or relax the lower esophageal sphincter, making GERD more likely. It's best to avoid the following: Coffee, tea, and carbonated drinks (with and without caffeine) Fatty, fried, or spicy food Mint, chocolate, onions, and tomatoes Any other foods that seem to irritate your stomach or cause you pain  Relieve the Pressure Eat smaller meals, even if you have to eat more often. Don't lie down right after you eat. Wait a few hours for your stomach to empty. Avoid tight belts and tight-fitting clothes. Lose excess weight.  Tobacco and Alcohol Avoid smoking tobacco and drinking alcohol. They can make GERD symptoms worse.  Reducing Pollen Exposure The American Academy of Allergy, Asthma and Immunology suggests the following steps to reduce your exposure to pollen during allergy seasons. Do not hang sheets or clothing out to dry; pollen may collect on these items. Do not mow lawns or spend time around freshly cut grass; mowing stirs up pollen. Keep windows closed at night.  Keep car windows  closed while driving. Minimize morning activities outdoors, a time when pollen counts are usually at their highest. Stay indoors as much as possible when pollen counts or humidity is high and on windy days when pollen tends to remain in the air longer. Use air conditioning when possible.  Many air conditioners have filters that trap the pollen spores. Use a HEPA room air filter to remove pollen form the  indoor air you breathe.  Control of Cockroach Allergen Cockroach allergen has been identified as an important cause of acute attacks of asthma, especially in urban settings.  There are fifty-five species of cockroach that exist in the [place], however only three, the [place], [place] species produce allergen that can affect patients with Asthma.  Allergens can be obtained from fecal particles, egg casings and secretions from cockroaches.    Remove food sources. Reduce access to water. Seal access and entry points. Spray runways with 0.5-1% Diazinon or Chlorpyrifos Blow boric acid power under stoves and refrigerator. Place bait stations (hydramethylnon) at feeding sites.    Control of Dust Mite Allergen Dust mites play a major role in allergic asthma and rhinitis. They occur in environments with high humidity wherever human skin is found. Dust mites absorb humidity from the atmosphere (ie, they do not drink) and feed on organic matter (including shed human and animal skin). Dust mites are a microscopic type of insect that you cannot see with the naked eye. High levels of dust mites have been detected from mattresses, pillows, carpets, upholstered furniture, bed covers, clothes, soft toys and any woven material. The principal allergen of the dust mite is found in its feces. A gram of dust may contain 1,000 mites and 250,000 fecal particles. Mite antigen is easily measured in the air during house cleaning activities. Dust mites do not bite and do not cause harm to humans, other than by triggering allergies/asthma.  Ways to decrease your exposure to dust mites in your home:  1. Encase mattresses, box springs and pillows with a mite-impermeable barrier or cover  2. Wash sheets, blankets and drapes weekly in hot water (130 F) with detergent and dry them in a dryer on the hot setting.  3. Have the room cleaned frequently with a vacuum cleaner and a damp dust-mop. For carpeting or  rugs, vacuuming with a vacuum cleaner equipped with a high-efficiency particulate air (HEPA) filter. The dust mite allergic individual should not be in a room which is being cleaned and should wait 1 hour after cleaning before going into the room.  4. Do not sleep on upholstered furniture (eg, couches).  5. If possible removing carpeting, upholstered furniture and drapery from the home is ideal. Horizontal blinds should be eliminated in the rooms where the person spends the most time (bedroom, study, television room). Washable vinyl, roller-type shades are optimal.  6. Remove all non-washable stuffed toys from the bedroom. Wash stuffed toys weekly like sheets and blankets above.  7. Reduce indoor humidity to less than 50%. Inexpensive humidity monitors can be purchased at most hardware stores. Do not use a humidifier as can make the problem worse and are not recommended.  Control of Dog or Cat Allergen Avoidance is the best way to manage a dog or cat allergy. If you have a dog or cat and are allergic to dog or cats, consider removing the dog or cat from the home. If you have a dog or cat but don't want to find it a new home, or if your  family wants a pet even though someone in the household is allergic, here are some strategies that may help keep symptoms at bay:  Keep the pet out of your bedroom and restrict it to only a few rooms. Be advised that keeping the dog or cat in only one room will not limit the allergens to that room. Don't pet, hug or kiss the dog or cat; if you do, wash your hands with soap and water. High-efficiency particulate air (HEPA) cleaners run continuously in a bedroom or living room can reduce allergen levels over time. Regular use of a high-efficiency vacuum cleaner or a central vacuum can reduce allergen levels. Giving your dog or cat a bath at least once a week can reduce airborne allergen.

## 2022-09-24 ENCOUNTER — Other Ambulatory Visit: Payer: Self-pay | Admitting: *Deleted

## 2022-09-24 MED ORDER — OMALIZUMAB 75 MG/0.5ML ~~LOC~~ SOSY: 375.0000 mg | PREFILLED_SYRINGE | SUBCUTANEOUS | 11 refills | Status: DC

## 2022-09-24 MED ORDER — OMALIZUMAB 150 MG/ML ~~LOC~~ SOSY: 375.0000 mg | PREFILLED_SYRINGE | SUBCUTANEOUS | 11 refills | Status: DC

## 2022-09-28 ENCOUNTER — Other Ambulatory Visit: Payer: Self-pay | Admitting: Internal Medicine

## 2022-11-20 ENCOUNTER — Encounter: Payer: Self-pay | Admitting: *Deleted

## 2022-12-20 ENCOUNTER — Other Ambulatory Visit: Payer: Self-pay | Admitting: Internal Medicine

## 2023-02-12 ENCOUNTER — Telehealth: Payer: Self-pay | Admitting: Family Medicine

## 2023-02-12 ENCOUNTER — Other Ambulatory Visit: Payer: Self-pay | Admitting: *Deleted

## 2023-02-12 MED ORDER — SCOPOLAMINE 1 MG/3DAYS TD PT72: 1.0000 | MEDICATED_PATCH | TRANSDERMAL | 0 refills | Status: DC

## 2023-02-12 NOTE — Telephone Encounter (Signed)
Should be this pharmacy  [facility] [phone] - [place], [place] - [address] AT [address] [facility] [address]

## 2023-02-12 NOTE — Telephone Encounter (Signed)
Pt dad called and is requesting motion sickess patches for her they are going on a cruise they are leaving June the 1st Pt uses [id number] - [place], [place] - [address] AT [address] [facility] [address]

## 2023-05-28 ENCOUNTER — Other Ambulatory Visit: Payer: Self-pay | Admitting: Family Medicine

## 2023-05-28 DIAGNOSIS — L700 Acne vulgaris: Secondary | ICD-10-CM

## 2023-05-28 NOTE — Telephone Encounter (Signed)
Left detailed message that pt is due for a visit and to call back to schedule with Dr. Lynelle Doctor

## 2023-05-28 NOTE — Telephone Encounter (Signed)
Pt. Sent message that she needs to schedule her yearly physical.

## 2023-06-10 ENCOUNTER — Other Ambulatory Visit: Payer: Self-pay | Admitting: *Deleted

## 2023-06-10 MED ORDER — OMALIZUMAB 75 MG/0.5ML ~~LOC~~ SOSY: 375.0000 mg | PREFILLED_SYRINGE | SUBCUTANEOUS | 11 refills | Status: DC

## 2023-06-10 MED ORDER — OMALIZUMAB 300 MG/2  ML ~~LOC~~ SOSY: 375.0000 mg | PREFILLED_SYRINGE | SUBCUTANEOUS | 11 refills | Status: DC

## 2023-06-21 ENCOUNTER — Other Ambulatory Visit: Payer: Self-pay | Admitting: Family Medicine

## 2023-06-21 DIAGNOSIS — F32A Depression, unspecified: Secondary | ICD-10-CM

## 2023-06-25 ENCOUNTER — Ambulatory Visit: Payer: Self-pay | Admitting: Family Medicine

## 2023-07-01 NOTE — Patient Instructions (Incomplete)
  HEALTH MAINTENANCE RECOMMENDATIONS:  It is recommended that you get at least 30 minutes of aerobic exercise at least 5 days/week (for weight loss, you may need as much as 60-90 minutes). This can be any activity that gets your heart rate up. This can be divided in 10-15 minute intervals if needed, but try and build up your endurance at least once a week.  Weight bearing exercise is also recommended twice weekly.  Eat a healthy diet with lots of vegetables, fruits and fiber.  "Colorful" foods have a lot of vitamins (ie green vegetables, tomatoes, red peppers, etc).  Limit sweet tea, regular sodas and alcoholic beverages, all of which has a lot of calories and sugar.  It is not recommended to drink more than 1 alcoholic drink daily.  Drink a lot of water.  Calcium recommendations are 1200-1500 mg daily (1500 mg for postmenopausal women or women without ovaries), and vitamin D 1000 IU daily.  This should be obtained from diet and/or supplements (vitamins), and calcium should not be taken all at once, but in divided doses.  Monthly self breast exams and yearly mammograms for women over the age of 105 is recommended.  Sunscreen of at least SPF 30 should be used on all sun-exposed parts of the skin when outside between the hours of 10 am and 4 pm (not just when at beach or pool, but even with exercise, golf, tennis, and yard work!)  Use a sunscreen that says "broad spectrum" so it covers both UVA and UVB rays, and make sure to reapply every 1-2 hours.  Remember to change the batteries in your smoke detectors when changing your clock times in the spring and fall. Carbon monoxide detectors are recommended for your home.  Use your seat belt every time you are in a car, and please drive safely and not be distracted with cell phones and texting while driving.  I encourage you to consider getting the new COVID booster.  Remember to have Plan B on hand (since you are no longer on birth control pills), in case  there is an ever an issue with the condom.  If your B12 is low, we will have you start a separate supplement.  Look for these results in your MyChart portal.

## 2023-07-01 NOTE — Progress Notes (Unsigned)
No chief complaint on file.  Samantha Cole is a 22 y.o. female who presents for a complete physical.    GERD--seen in 12/2021 at [facility] family medicine with R sided CP.  She takes Pepcid at night. Heartburn in the morning? Energy drinks? Coffee? Alcohol, tomatoes, elevating HOB?  Contraception:  Patient is on OCP's for contraception (along with condoms), cramps and acne. Cycles are normal, no significant cramps.  Periods are light and shorter.  She is tolerating OCPs with any side effects--no nausea, migraines, no abnormal bleeding. Acne remains well controlled. Sexually active. Uses condoms.  STD check in 2022. ??same partner?***  Anxiety and Depression: She has been on citalopram since 05/2018, and on the 40mg  dose since 11/2019. She had seen therapist at school that year, not really needed since then. She is tolerating medications without side effects. She reports doing very well on the current dose.  She has asthma and allergies, [facility]. She is on Xolair q2 weeks and doing well.   Last used albuterol ***UPDATE  Uses Allegra as needed for allergies,  Azelastine-fluticasone spray--uses seasonally, hasn't started it yet.  She has tree-nut and shellfish allergy.  She has an Auvi-Q pen.  B12 level was low in 07/2020, at 267. Last level was low at 280 in 05/2022 when she hadn't taken any MVI in about 6 weeks. She is currently taking   Lab Results  Component Value Date   VITAMINB12 280 05/27/2022   Lab Results  Component Value Date   WBC 6.7 05/27/2022   HGB 12.9 05/27/2022   HCT 39.0 05/27/2022   MCV 82 05/27/2022   PLT 343 05/27/2022      Immunization History  Administered Date(s) Administered   DTaP 10/30/2000, 05/19/2001, 12/10/2002, 06/19/2004, 01/23/2006   HIB (PRP-OMP) 10/30/2000, 01/01/2001, 03/06/2001, 12/10/2002   HPV 9-valent 07/22/2018, 04/21/2019   HPV Quadrivalent 01/14/2019   Hepatitis A 12/10/2002, 06/19/2004   Hepatitis B  10-28-1999, 08/28/2000, 03/06/2001   IPV 01/01/2001, 03/06/2001, 04/29/2001, 01/23/2006   Influenza Split 08/10/2012   Influenza,inj,Quad PF,6+ Mos 09/15/2013, 08/17/2014, 09/04/2015, 08/12/2016, 08/15/2017, 07/22/2018, 07/20/2019, 08/13/2020   Influenza-Unspecified 06/28/2021   MMR 12/10/2002, 01/23/2006   Meningococcal B, OMV 11/10/2017, 04/21/2019   Meningococcal Conjugate 03/29/2015   Meningococcal Mcv4o 11/10/2017   Moderna Sars-Covid-2 Vaccination 01/07/2020, 02/07/2020, 01/04/2021   Pneumococcal-Unspecified 10/30/2000, 06/19/2004   Tdap 06/09/2012, 05/27/2022   Vitamin D-OH level of 49.5 in 05/2022 Lipid screen: Lab Results  Component Value Date   CHOL 146 03/29/2015   HDL 72 03/29/2015   LDLCALC 53 03/29/2015   TRIG 105 03/29/2015   CHOLHDL 2.0 03/29/2015   Last Pap smear: 05/2022 (no transformation zone, otherwise normal). Dentist: twice a year Ophtho: yearly Exercise:  3-4 days/week  walks 1 mile and at-home exercise (mountain climbers, crunches).  She has no gym access over the summer.  Typical routine at school is walking, gym 3x/week--weights.   No longer eats dairy. Drinks oat milk or soy milk.    PMH, PSH, SH and FH reviewed and updated     ROS: No fever, chills, headaches, dizziness, fainting spells, URI symptoms, cough, shortness of breath, chest pain, palpitations, nausea, vomiting, bowel changes, urinary complaints. Periods are regular. No bleeding, bruising, rash, joint pains or other concerns. Acne is well controlled. Moods are controlled.  Allergies are controlled.  Asthma is flaring some with her reflux. Waking up wheezing at night. UPDATE +Weight gain--she admits to eating out more since May. +sugar in coffee. Infrequent OJ,  alcohol once or twice a week. Has some intermittent pain at R chest with deep breaths.   PHYSICAL EXAM:  There were no vitals taken for this visit.  Wt Readings from Last 3 Encounters:  09/04/22 174 lb 4.8 oz (79.1 kg)   05/27/22 171 lb (77.6 kg)  08/23/21 148 lb 3.2 oz (67.2 kg)   Well developed, pleasant, cooperative female in no distress HEENT: PERRL, EOMI, conjunctiva clear.  TM's and EAC's normal.  No sinus tenderness. OP clear Neck: no lymphadenopathy, thyromegaly or mass Heart: regular rate and rhythm without murmur Lungs: clear bilaterally.  Good air movement. No wheezes, rales, ronchi Abdomen: soft, nontender, no organomegaly or mass Back: no spinal tenderness, scoliosis or CVA tenderness Chest wall: nontender Breast exam: no nipple discharge, inversion, no skin dimpling, breast masses or axillary lymphadenopathy GU: normal external genitalia, no lesions. No abnormal vaginal discharge.  Cervix is very deep and posterior, no cervical motion tenderness.  Pap not obtained. No uterine or adnexal tenderness or masses. Extremities: no clubbing, cyanosis or edema.  2+ pulses Skin: no rashes, suspicious lesions or bruising. Multiple tattoos present. Psych: normal mood, affect, hygiene and grooming Neuro: alert and oriented. Normal strength, sensation, gait, and DTR's 2+ and symmetric.      05/27/2022    1:25 PM 05/14/2021    1:55 PM 05/10/2020   10:23 AM 05/10/2020    9:25 AM 03/02/2020   12:21 PM  Depression screen PHQ 2/9  Decreased Interest 0 1 0 0 0  Down, Depressed, Hopeless 0 1 0 0 1  PHQ - 2 Score 0 2 0 0 1  Altered sleeping 0 2 0  0  Tired, decreased energy 1 0 0  0  Change in appetite 0 0 0  1  Feeling bad or failure about yourself  0 0 0  0  Trouble concentrating 0 2 0  0  Moving slowly or fidgety/restless 0 0 0  0  Suicidal thoughts 0 0 0  0  PHQ-9 Score 1 6 0  2  Difficult doing work/chores Not difficult at all Not difficult at all         05/27/2022    1:26 PM 05/14/2021    2:21 PM 05/10/2020   10:23 AM 03/02/2020   12:25 PM  GAD 7 : Generalized Anxiety Score  Nervous, Anxious, on Edge 1 0 0 0  Control/stop worrying 0 0 0 0  Worry too much - different things 0 0 0 0  Trouble  relaxing 0 0 0 0  Restless 0 1 0 0  Easily annoyed or irritable 3 1 0 0  Afraid - awful might happen 0 0 0 0  Total GAD 7 Score 4 2 0 0  Anxiety Difficulty Not difficult at all       ASSESSMENT/PLAN:   PHQ-9, GAD-7 Will need 90d rx for celexa w/RF (only got 30 recently).  Flu/COVID Will need pneumovax also at another time.  Pap next year (may need longer speculum and abdominal pressure).  Labs? CBC, B12, STD? TSH if sx? D if noncompliant?  Discussed monthly self breast exams, at least 30 minutes of aerobic activity at least 5 days/week, weight-bearing exercise at least 2x/week; proper sunscreen use reviewed; healthy diet, including goals of calcium and vitamin D intake; regular seatbelt use; safe sex, avoidance of excessive alcohol, recreational drugs.  Immunization recommendations discussed-- Flu shot COVID booster Recommend pneumovax (due to asthma)   F/u 1 year, sooner prn.

## 2023-07-02 ENCOUNTER — Other Ambulatory Visit: Payer: Self-pay | Admitting: Family Medicine

## 2023-07-02 ENCOUNTER — Ambulatory Visit (INDEPENDENT_AMBULATORY_CARE_PROVIDER_SITE_OTHER): Payer: 59 | Admitting: Family Medicine

## 2023-07-02 VITALS — BP 108/70 | HR 68 | Ht 68.0 in | Wt 181.4 lb

## 2023-07-02 DIAGNOSIS — F32A Depression, unspecified: Secondary | ICD-10-CM

## 2023-07-02 DIAGNOSIS — Z23 Encounter for immunization: Secondary | ICD-10-CM | POA: Diagnosis not present

## 2023-07-02 DIAGNOSIS — R7989 Other specified abnormal findings of blood chemistry: Secondary | ICD-10-CM

## 2023-07-02 DIAGNOSIS — F419 Anxiety disorder, unspecified: Secondary | ICD-10-CM | POA: Diagnosis not present

## 2023-07-02 DIAGNOSIS — L700 Acne vulgaris: Secondary | ICD-10-CM

## 2023-07-02 DIAGNOSIS — J3089 Other allergic rhinitis: Secondary | ICD-10-CM

## 2023-07-02 DIAGNOSIS — Z0000 Encounter for general adult medical examination without abnormal findings: Secondary | ICD-10-CM

## 2023-07-02 DIAGNOSIS — K219 Gastro-esophageal reflux disease without esophagitis: Secondary | ICD-10-CM | POA: Diagnosis not present

## 2023-07-02 DIAGNOSIS — J302 Other seasonal allergic rhinitis: Secondary | ICD-10-CM

## 2023-07-02 DIAGNOSIS — J4550 Severe persistent asthma, uncomplicated: Secondary | ICD-10-CM

## 2023-07-02 MED ORDER — EPINEPHRINE 0.3 MG/0.3ML IJ SOAJ: 0.3000 mg | INTRAMUSCULAR | 1 refills | Status: AC | PRN

## 2023-07-02 MED ORDER — CITALOPRAM HYDROBROMIDE 40 MG PO TABS
40.0000 mg | ORAL_TABLET | Freq: Every day | ORAL | 3 refills | Status: DC
Start: 2023-07-02 — End: 2024-07-27

## 2023-07-02 MED ORDER — FAMOTIDINE 40 MG PO TABS: 40.0000 mg | ORAL_TABLET | Freq: Every day | ORAL | 3 refills | Status: AC

## 2023-07-02 NOTE — Telephone Encounter (Signed)
Spoke with pharmacist and they will fill Auvi-Q there is a system glitch and we cannot send.

## 2023-07-03 LAB — CBC WITH DIFFERENTIAL/PLATELET
Basophils Absolute: 0.1 10*3/uL (ref 0.0–0.2)
Basos: 1 %
EOS (ABSOLUTE): 1.3 10*3/uL — ABNORMAL HIGH (ref 0.0–0.4)
Eos: 18 %
Hematocrit: 44.7 % (ref 34.0–46.6)
Hemoglobin: 14.8 g/dL (ref 11.1–15.9)
Immature Grans (Abs): 0 10*3/uL (ref 0.0–0.1)
Immature Granulocytes: 0 %
Lymphocytes Absolute: 2.2 10*3/uL (ref 0.7–3.1)
Lymphs: 31 %
MCH: 31.4 pg (ref 26.6–33.0)
MCHC: 33.1 g/dL (ref 31.5–35.7)
MCV: 95 fL (ref 79–97)
Monocytes Absolute: 0.6 10*3/uL (ref 0.1–0.9)
Monocytes: 8 %
Neutrophils Absolute: 3.1 10*3/uL (ref 1.4–7.0)
Neutrophils: 42 %
Platelets: 312 10*3/uL (ref 150–450)
RBC: 4.71 x10E6/uL (ref 3.77–5.28)
RDW: 11.5 % — ABNORMAL LOW (ref 11.7–15.4)
WBC: 7.3 10*3/uL (ref 3.4–10.8)

## 2023-07-03 LAB — VITAMIN B12: Vitamin B-12: 526 pg/mL (ref 232–1245)

## 2023-10-02 ENCOUNTER — Encounter (INDEPENDENT_AMBULATORY_CARE_PROVIDER_SITE_OTHER): Payer: Self-pay

## 2024-01-26 ENCOUNTER — Telehealth: Payer: Self-pay | Admitting: Internal Medicine

## 2024-01-26 NOTE — Telephone Encounter (Signed)
 Left voicemail to give the office a call back to schedule Xolair reapproval appointment.

## 2024-06-09 ENCOUNTER — Other Ambulatory Visit: Payer: Self-pay | Admitting: Internal Medicine

## 2024-07-26 ENCOUNTER — Other Ambulatory Visit: Payer: Self-pay | Admitting: Family Medicine

## 2024-07-26 DIAGNOSIS — F32A Depression, unspecified: Secondary | ICD-10-CM

## 2024-07-26 DIAGNOSIS — K219 Gastro-esophageal reflux disease without esophagitis: Secondary | ICD-10-CM

## 2024-07-26 NOTE — Telephone Encounter (Signed)
 Left message asking patient to please call and schedule CPE as she is past due.

## 2024-07-27 ENCOUNTER — Other Ambulatory Visit: Payer: Self-pay | Admitting: *Deleted

## 2024-07-27 ENCOUNTER — Encounter: Payer: Self-pay | Admitting: *Deleted

## 2024-07-27 DIAGNOSIS — F419 Anxiety disorder, unspecified: Secondary | ICD-10-CM

## 2024-07-27 MED ORDER — CITALOPRAM HYDROBROMIDE 40 MG PO TABS: 40.0000 mg | ORAL_TABLET | Freq: Every day | ORAL | 0 refills | Status: AC

## 2024-07-27 NOTE — Telephone Encounter (Signed)
 Sent My-Chart message
